# Patient Record
Sex: Female | Born: 1980 | Race: White | Hispanic: No | Marital: Married | Smoking: Never smoker
Health system: Southern US, Community
[De-identification: ages and names within clinical notes are randomized; demographics above are authoritative.]

## PROBLEM LIST (undated history)

## (undated) DIAGNOSIS — Z8742 Personal history of other diseases of the female genital tract: Secondary | ICD-10-CM

## (undated) DIAGNOSIS — B009 Herpesviral infection, unspecified: Secondary | ICD-10-CM

## (undated) DIAGNOSIS — K5190 Ulcerative colitis, unspecified, without complications: Secondary | ICD-10-CM

## (undated) DIAGNOSIS — K069 Disorder of gingiva and edentulous alveolar ridge, unspecified: Secondary | ICD-10-CM

## (undated) DIAGNOSIS — Z87442 Personal history of urinary calculi: Secondary | ICD-10-CM

## (undated) DIAGNOSIS — Z8719 Personal history of other diseases of the digestive system: Secondary | ICD-10-CM

## (undated) DIAGNOSIS — R87629 Unspecified abnormal cytological findings in specimens from vagina: Secondary | ICD-10-CM

## (undated) DIAGNOSIS — N921 Excessive and frequent menstruation with irregular cycle: Secondary | ICD-10-CM

## (undated) HISTORY — DX: Unspecified abnormal cytological findings in specimens from vagina: R87.629

## (undated) HISTORY — PX: CYSTOSCOPY, WITH RETROGRADE PYELOGRAM, URETEROSCOPY, URINARY CALCULUS LASER LITHOTRIPSY, AND STENT INSERT: SHX7550

## (undated) HISTORY — PX: OTHER SURGICAL HISTORY: SHX169

## (undated) HISTORY — DX: Ulcerative colitis, unspecified, without complications: K51.90

## (undated) HISTORY — DX: Herpesviral infection, unspecified: K06.9

## (undated) HISTORY — DX: Herpesviral infection, unspecified: B00.9

---
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
## 2009-09-05 ENCOUNTER — Encounter: Admission: RE | Admit: 2009-09-05 | Discharge: 2009-09-05 | Payer: Self-pay | Admitting: Family Medicine

## 2011-04-17 ENCOUNTER — Emergency Department (HOSPITAL_COMMUNITY)
Admission: EM | Admit: 2011-04-17 | Discharge: 2011-04-17 | Disposition: A | Discharge status: Home / Self Care | Payer: BC Managed Care – PPO | Attending: Emergency Medicine | Admitting: Emergency Medicine

## 2011-04-17 DIAGNOSIS — N200 Calculus of kidney: Secondary | ICD-10-CM | POA: Insufficient documentation

## 2011-04-17 DIAGNOSIS — R109 Unspecified abdominal pain: Secondary | ICD-10-CM | POA: Insufficient documentation

## 2011-04-17 DIAGNOSIS — R110 Nausea: Secondary | ICD-10-CM | POA: Insufficient documentation

## 2011-04-17 DIAGNOSIS — Z87442 Personal history of urinary calculi: Secondary | ICD-10-CM | POA: Insufficient documentation

## 2011-04-17 DIAGNOSIS — R300 Dysuria: Secondary | ICD-10-CM | POA: Insufficient documentation

## 2011-04-17 LAB — POCT I-STAT, CHEM 8
BUN: 21 mg/dL (ref 6–23)
Calcium, Ion: 1.14 mmol/L (ref 1.12–1.32)
Chloride: 105 meq/L (ref 96–112)
Creatinine, Ser: 1.3 mg/dL — ABNORMAL HIGH (ref 0.50–1.10)
Glucose, Bld: 88 mg/dL (ref 70–99)
HCT: 46 % (ref 36.0–46.0)
Hemoglobin: 15.6 g/dL — ABNORMAL HIGH (ref 12.0–15.0)
Potassium: 4.2 meq/L (ref 3.5–5.1)
Sodium: 138 meq/L (ref 135–145)
TCO2: 23 mmol/L (ref 0–100)

## 2011-04-17 LAB — URINALYSIS, ROUTINE W REFLEX MICROSCOPIC
Glucose, UA: NEGATIVE mg/dL
Ketones, ur: NEGATIVE mg/dL
pH: 5.5 (ref 5.0–8.0)

## 2011-04-17 LAB — URINE MICROSCOPIC-ADD ON

## 2011-04-17 LAB — POCT PREGNANCY, URINE: Preg Test, Ur: NEGATIVE

## 2013-08-13 HISTORY — PX: HIP ARTHROSCOPY: SUR88

## 2018-02-13 ENCOUNTER — Ambulatory Visit: Payer: Self-pay | Admitting: Family Medicine

## 2018-02-13 VITALS — BP 118/74 | HR 64 | Temp 98.9°F | Resp 20

## 2018-02-13 DIAGNOSIS — N3001 Acute cystitis with hematuria: Secondary | ICD-10-CM

## 2018-02-13 LAB — POCT URINALYSIS DIPSTICK
Glucose, UA: NEGATIVE
Ketones, UA: NEGATIVE
Leukocytes, UA: NEGATIVE
Nitrite, UA: POSITIVE
Protein, UA: POSITIVE — AB
Spec Grav, UA: >=1.03 — AB
Urobilinogen, UA: 0.2 U/dL
pH, UA: 6

## 2018-02-13 MED ORDER — DOXYCYCLINE HYCLATE 100 MG PO CAPS: 100.0000 mg | ORAL_CAPSULE | Freq: Two times a day (BID) | ORAL | 0 refills | Status: DC

## 2018-02-13 MED ORDER — FLUCONAZOLE 150 MG PO TABS: 150.0000 mg | ORAL_TABLET | Freq: Once | ORAL | 0 refills | Status: AC

## 2018-02-13 NOTE — Patient Instructions (Addendum)
Complete all medication as prescribed. If symptoms worsen or do not improve return for further evaluation. Take antibiotic with food to avoid stomach upset. I have also prescribed you a diflucan in the event vaginal irritation develops.   Urinary Tract Infection, Adult A urinary tract infection (UTI) is an infection of any part of the urinary tract. The urinary tract includes the:  Kidneys.  Ureters.  Bladder.  Urethra.  These organs make, store, and get rid of pee (urine) in the body. Follow these instructions at home:  Take over-the-counter and prescription medicines only as told by your doctor.  If you were prescribed an antibiotic medicine, take it as told by your doctor. Do not stop taking the antibiotic even if you start to feel better.  Avoid the following drinks: ? Alcohol. ? Caffeine. ? Tea. ? Carbonated drinks.  Drink enough fluid to keep your pee clear or pale yellow.  Keep all follow-up visits as told by your doctor. This is important.  Make sure to: ? Empty your bladder often and completely. Do not to hold pee for long periods of time. ? Empty your bladder before and after sex. ? Wipe from front to back after a bowel movement if you are female. Use each tissue one time when you wipe. Contact a doctor if:  You have back pain.  You have a fever.  You feel sick to your stomach (nauseous).  You throw up (vomit).  Your symptoms do not get better after 3 days.  Your symptoms go away and then come back. Get help right away if:  You have very bad back pain.  You have very bad lower belly (abdominal) pain.  You are throwing up and cannot keep down any medicines or water. This information is not intended to replace advice given to you by your health care provider. Make sure you discuss any questions you have with your health care provider. Document Released: 01/09/2008 Document Revised: 12/29/2015 Document Reviewed: 06/13/2015 Elsevier Interactive Patient  Education  [employer]2018 Elsevier Inc[employer]

## 2018-02-13 NOTE — Addendum Note (Signed)
Addended by: Denton BrickHOLLOWAY, Elhadji Pecore F on: 02/13/2018 08:03 PM   Modules accepted: Orders

## 2018-02-13 NOTE — Progress Notes (Signed)
Patient ID: Ellen Stewart, female    DOB: 1981-02-27, 37 y.o.   MRN: [id number]  PCP: No primary care provider on file.  Chief Complaint  Patient presents with  . uti symptoms    Subjective:  HPI Ellen Stewart is a 37 y.o. female presents for evaluation UTI. Symptoms include visible hematuria and dysuria.Denies flank or back pain, fever, chills, nausea or vomiting. She has taken over the counter medication for dysuria with some relief of pain. She has a history of UTI which have previously resolved with different antibiotic therapies. Social History   Socioeconomic History  . Marital status: Unknown    Spouse name: Not on file  . Number of children: Not on file  . Years of education: Not on file  . Highest education level: Not on file  Occupational History  . Not on file  Social Needs  . Financial resource strain: Not on file  . Food insecurity:    Worry: Not on file    Inability: Not on file  . Transportation needs:    Medical: Not on file    Non-medical: Not on file  Tobacco Use  . Smoking status: Not on file  Substance and Sexual Activity  . Alcohol use: Not on file  . Drug use: Not on file  . Sexual activity: Not on file  Lifestyle  . Physical activity:    Days per week: Not on file    Minutes per session: Not on file  . Stress: Not on file  Relationships  . Social connections:    Talks on phone: Not on file    Gets together: Not on file    Attends religious service: Not on file    Active member of club or organization: Not on file    Attends meetings of clubs or organizations: Not on file    Relationship status: Not on file  . Intimate partner violence:    Fear of current or ex partner: Not on file    Emotionally abused: Not on file    Physically abused: Not on file    Forced sexual activity: Not on file  Other Topics Concern  . Not on file  Social History Narrative  . Not on file   Review of Systems Pertinent negatives listed in HPI   Allergies not on file  Prior to Admission medications   Not on File    Past Medical, Surgical Family and Social History reviewed and updated.    Objective:   Today's Vitals   02/13/18 1933  BP: 118/74  Pulse: 64  Resp: 20  Temp: 98.9 F (37.2 C)  TempSrc: Oral    Physical Exam  Constitutional: She appears well-developed and well-nourished.  HENT:  Head: Normocephalic and atraumatic.  Cardiovascular: Normal rate, regular rhythm, normal heart sounds and intact distal pulses.  Pulmonary/Chest: Effort normal and breath sounds normal.  Abdominal: Soft. There is no CVA tenderness.  Skin: Skin is warm and dry.   Assessment & Plan:  1. Acute cystitis with hematuria, treating empirically with a broad-spectrum antibiotic. UA positive nitrates, protein and RBC. Patient give strict return instructions if symptoms worsen or do not improve to return for care.  Meds ordered this encounter  Medications  . doxycycline (VIBRAMYCIN) 100 MG capsule    Sig: Take 1 capsule (100 mg total) by mouth 2 (two) times daily.    Dispense:  20 capsule    Refill:  0  . fluconazole (DIFLUCAN) 150 MG tablet  Sig: Take 1 tablet (150 mg total) by mouth once for 1 dose.    Dispense:  1 tablet    Refill:  0    Godfrey Pick. Tiburcio Pea, MSN, FNP-C [facility]  [address]  [place], [place] [address] [phone]

## 2018-02-17 ENCOUNTER — Telehealth: Payer: Self-pay | Admitting: Emergency Medicine

## 2018-02-17 MED ORDER — CIPROFLOXACIN HCL 250 MG PO TABS: 250.0000 mg | ORAL_TABLET | Freq: Two times a day (BID) | ORAL | 0 refills | Status: AC

## 2018-02-19 NOTE — Telephone Encounter (Signed)
Pt had called stating urinary symptoms were worsening despite being on Doxycycline. Pt is out of town for a week. Discussed pt with Jerrilyn CairoKim Harris, NP, who pt initially saw. Cipro was called in to pharmacy for pt. Pt advised to f/u if still not improving after cipro

## 2018-08-06 HISTORY — PX: HYSTEROSCOPY WITH D & C: SHX1775

## 2019-01-11 ENCOUNTER — Encounter (INDEPENDENT_AMBULATORY_CARE_PROVIDER_SITE_OTHER): Payer: Self-pay

## 2019-01-11 ENCOUNTER — Telehealth: Payer: Self-pay | Admitting: Physician Assistant

## 2019-01-11 DIAGNOSIS — R05 Cough: Secondary | ICD-10-CM

## 2019-01-11 DIAGNOSIS — J029 Acute pharyngitis, unspecified: Secondary | ICD-10-CM

## 2019-01-11 DIAGNOSIS — R059 Cough, unspecified: Secondary | ICD-10-CM

## 2019-01-11 NOTE — Progress Notes (Signed)
E-Visit for Corona Virus Screening   Based on your current symptoms, you may very well have the virus, however your symptoms are mild. Currently, not all patients are being tested. If the symptoms are mild and there is not a known exposure, performing the test is not indicated.  You have been enrolled in MyChart Home Monitoring for COVID-19. Daily you will receive a questionnaire within the MyChart website. Our COVID-19 response team will be monitoring your responses daily.   Coronavirus disease 2019 (COVID-19)is a respiratory illness that can spread from person to person. The virus that causes COVID-19 is a new virus that was first identified in the country of [place] but is now found in multiple other countries and has spread to the [place].  Symptoms associated with the virus are mild to severe fever, cough, and shortness of breath. There is currently no vaccine to protect against COVID-19, and there is no specific antiviral treatment for the virus.   To be considered HIGH RISK for Coronavirus (COVID-19), you have to meet the following criteria:  . Traveled to [place], [place], [place], [place] or [place]; or in the [place] to [place], [place], [place], or [place]; and have fever, cough, and shortness of breath within the last 2 weeks of travel OR  . Been in close contact with a person diagnosed with COVID-19 within the last 2 weeks and have fever, cough, and shortness of breath  . IF YOU DO NOT MEET THESE CRITERIA, YOU ARE CONSIDERED LOW RISK FOR COVID-19.   It is vitally important that if you feel that you have an infection such as this virus or any other virus that you stay home and away from places where you may spread it to others.  You should self-quarantine for 14 days if you have symptoms that could potentially be coronavirus and avoid contact with people age 38 and older.     You may also take acetaminophen (Tylenol) as needed for fever.   Reduce your risk of any  infection by using the same precautions used for avoiding the common cold or flu: Wash your hands often with soap and warm water for at least 20 seconds.  If soap and water are not readily available, use an alcohol-based hand sanitizer with at least 60% alcohol.  If coughing or sneezing, cover your mouth and nose by coughing or sneezing into the elbow areas of your shirt or coat, into a tissue or into your sleeve (not your hands). Avoid shaking hands with others and consider head nods or verbal greetings only.  Avoid touching your eyes,nose, or mouth with unwashed hands. Avoid close contact with people who are sick. Avoid places or events with large numbers of people in one location, like concerts or sporting events. Carefully consider travel plans you have or are making. If you are planning any travel outside or inside the [place], visit the CDC'sTravelers' Health webpagefor the latest health notices. If you have some symptoms but not all symptoms, continue to monitor at home and seek medical attention if your symptoms worsen. If you are having a medical emergency, call 911.  HOME CARE Only take medications as instructed by your medical team. Drink plenty of fluids and get plenty of rest. A steam or ultrasonic humidifier can help if you have congestion.   GET HELP RIGHT AWAY IF: You develop worsening fever. You become short of breath You cough up blood. Your symptoms become more severe MAKE SURE YOU  Understand these instructions.  Will watch your condition. Will get help right away if you are not doing well or get worse.  Your e-visit answers were reviewed by a board certified advanced clinical practitioner to complete your personal care plan.  Depending on the condition, your plan could have included both over the counter or prescription medications.  If there is a problem please reply once you have received a response from your provider. Your safety is important to [place].  If you have  drug allergies check your prescription carefully.    You can use MyChart to ask questions about today's visit, request a non-urgent call back, or ask for a work or school excuse for 24 hours related to this e-Visit. If it has been greater than 24 hours you will need to follow up with your provider, or enter a new e-Visit to address those concerns. You will get an e-mail in the next two days asking about your experience.  I hope that your e-visit has been valuable and will speed your recovery. Thank you for using e-visits.    ===View-only below this line===   ----- Message -----    From: Hansel Feinstein    Sent: 01/11/2019 10:23 AM EDT      To: E-Visit Mailing List Subject: E-Visit Submission: CoronaVirus ([id number]-[id number]) Screening  E-Visit Submission: CoronaVirus ([id number]) Screening --------------------------------  Question: Do you have any of the following?  Answer:   Cough  Question: Do you have any of the following additional symptoms?  Answer:   Sore throat  Question: Have you had a fever? Answer:   No  Question: Have others in your home or workplace had similar symptoms? Answer:   No  Question: When did your symptoms start? Answer:   January 09, 2019  Question: Have you recently visited any of the following countries? Answer:   None of these  Question: If you have traveled anywhere in the last  2 months please document where you have visited: Answer:     Question: Have you recently been around others from these countries or visited these countries who have had coughing or fever? Answer:   No  Question: Have you recently been around anyone who has been diagnosed with Corona virus? Answer:   No  Question: Have you been taking any medications? Answer:   No  Question: If taking medications for these symptoms, please list the names and whether they are helping or not Answer:     Question: Are you treated for any of the following conditions: Asthma, COPD, Diabetes, Renal  Failure (on Dialysis), AIDS, any Neuromuscular disease that effects the clearing of secretions, Heart Failure, or Heart Disease? Answer:   No  Question: Please enter a phone number where you can be reached if we have additional questions about your symptoms Answer:   [phone]  Question: Please list your medication allergies that you may have ? (If 'none' , please list as 'none') Answer:   Percocet  Question: Please list any additional comments  Answer:   I am exposed to a lot people because of my job. I am requesting a Covid-19 test  Question: Are you pregnant? Answer:   I am confident that I am not pregnant  Question: Are you breastfeeding? Answer:   No  A total of 5-10 minutes was spent evaluating this patients questionnaire and formulating a plan of care.

## 2019-01-12 ENCOUNTER — Telehealth: Payer: Self-pay | Admitting: *Deleted

## 2019-01-12 ENCOUNTER — Encounter (INDEPENDENT_AMBULATORY_CARE_PROVIDER_SITE_OTHER): Payer: Self-pay

## 2019-01-12 NOTE — Telephone Encounter (Signed)
Contacted pt due to my chart response of worsening cough; recommendations made: 6: GENERAL CARE ADVICE FOR COVID-19 SYMPTOMS: * Cough: Use cough drops * Sore throat: Try throat lozenges, hard candy or warm chicken broth.    7: COUGH MEDICINES:  * OTC COUGH SYRUPS: The most common cough suppressant in OTC cough medications is dextromethorphan. Often the letters 'DM' appear in the name. * OTC COUGH DROPS: Cough drops can help a lot, especially for mild coughs. They reduce coughing by soothing your irritated throat and removing that tickle sensation in the back of the throat. Cough drops also have the advantage of portability - you can carry them with you. * HOME REMEDY - HARD CANDY: Hard candy works just as well as medicine-flavored OTC cough drops. People who have diabetes should use sugar-free candy. * HOME REMEDY - HONEY: This old home remedy has been shown to help decrease coughing at night. The adult dosage is 2 teaspoons (10 ml) at bedtime. Honey should not be given to infants under one year of age   Pt also advised to contact her PCP for further recommendations; she verbalized understanding.

## 2019-06-12 HISTORY — PX: HYSTEROSCOPY WITH D & C: SHX1775

## 2021-01-20 ENCOUNTER — Other Ambulatory Visit: Payer: Self-pay

## 2021-01-20 ENCOUNTER — Ambulatory Visit (INDEPENDENT_AMBULATORY_CARE_PROVIDER_SITE_OTHER): Payer: 59 | Admitting: Family Medicine

## 2021-01-20 ENCOUNTER — Encounter: Payer: Self-pay | Admitting: Family Medicine

## 2021-01-20 VITALS — BP 126/79 | HR 77 | Ht 61.5 in | Wt 110.8 lb

## 2021-01-20 DIAGNOSIS — Z0000 Encounter for general adult medical examination without abnormal findings: Secondary | ICD-10-CM

## 2021-01-20 DIAGNOSIS — R5383 Other fatigue: Secondary | ICD-10-CM

## 2021-01-20 NOTE — Progress Notes (Signed)
Hasnt had check up in a couple years Turning 40 soon Has night sweats started after covid Had Covid 2nd week of January Been really tired

## 2021-01-20 NOTE — Progress Notes (Signed)
Office Visit Note   Patient: Ellen Stewart           Date of Birth: Jul 30, 1981           MRN: [id number] Visit Date: 01/20/2021 Requested by: No referring provider defined for this encounter. PCP: No primary care provider on file.  Subjective: Chief Complaint  Patient presents with   PCP visit    HPI: She is here for a wellness examination.  She has not had 1 in years.  She goes to her gynecologist on a regular basis but not to a PCP.  About 4 months ago she had COVID-19 illness.  Prior to that she has had COVID vaccinations and boosters.  It was not a severe case of COVID, but ever since then she has struggled with fatigue.  The fatigue is not severe, but it is not normal for her to feel like this.  She occasionally has hot flashes but her menstrual cycles remain normal.  She does note that she has always had heavy bleeding.  She has never been diagnosed with anemia.  She denies any hair loss, skin or nail disorders.  She has gained about 5 pounds and cannot seem to lose it.  No constipation or diarrhea.  She does note that several years ago she was diagnosed with a mild case of ulcerative colitis.  This was after a year of diarrhea with bloody stools.  She was able to get this managed with some dietary changes.  Family history was reviewed.  Past surgery includes right hip arthroscopy for a labrum tear.  She has done well since then.               ROS:   All other systems were reviewed and are negative.  Objective: Vital Signs: BP 126/79   Pulse 77   Ht 5' 1.5" (1.562 m)   Wt 110 lb 12.8 oz (50.3 kg)   BMI 20.60 kg/m   Physical Exam:  General:  Alert and oriented, in no acute distress. Pulm:  Breathing unlabored. Psy:  Normal mood, congruent affect. Skin: No suspicious lesions HEENT:  [place]/AT, PERRLA, EOM Full, no nystagmus.  Funduscopic examination within normal limits.  No conjunctival erythema.  Tympanic membranes are pearly gray with normal landmarks.  External ear  canals are normal.  Nasal passages are clear.  Oropharynx is clear.  No significant lymphadenopathy.  No thyromegaly or nodules.  2+ carotid pulses without bruits. CV: Regular rate and rhythm without murmurs, rubs, or gallops.  No peripheral edema.  2+ radial and posterior tibial pulses. Lungs: Clear to auscultation throughout with no wheezing or areas of consolidation. Abdomen: No hepatosplenomegaly. Extremities: 2+ DTRs.  No nail deformities.   Imaging: No results found.  Assessment & Plan: Wellness examination -We will draw labs today.  2.  Fatigue, possibly post-COVID syndrome -Labs to rule out other causes of fatigue.  If negative, we will try vitamin protocol for long covid.  If still no improvement, then salivary hormone testing.     Procedures: No procedures performed        PMFS History: There are no problems to display for this patient.  History reviewed. No pertinent past medical history.  Family History  Problem Relation Age of Onset   Obesity Mother    Hypertension Mother    Cancer - Colon Maternal Uncle     History reviewed. No pertinent surgical history. Social History   Occupational History   Not on file  Tobacco Use  Smoking status: Not on file   Smokeless tobacco: Not on file  Substance and Sexual Activity   Alcohol use: Not on file   Drug use: Not on file   Sexual activity: Not on file

## 2021-01-23 ENCOUNTER — Telehealth: Payer: Self-pay | Admitting: Family Medicine

## 2021-01-23 LAB — COMPREHENSIVE METABOLIC PANEL
AG Ratio: 1.8 (calc) (ref 1.0–2.5)
ALT: 22 U/L (ref 6–29)
AST: 25 U/L (ref 10–30)
Albumin: 4.4 g/dL (ref 3.6–5.1)
Alkaline phosphatase (APISO): 48 U/L (ref 31–125)
BUN: 14 mg/dL (ref 7–25)
CO2: 28 mmol/L (ref 20–32)
Calcium: 9.5 mg/dL (ref 8.6–10.2)
Chloride: 103 mmol/L (ref 98–110)
Creat: 0.98 mg/dL (ref 0.50–1.10)
Globulin: 2.4 g/dL (calc) (ref 1.9–3.7)
Glucose, Bld: 71 mg/dL (ref 65–99)
Potassium: 4.8 mmol/L (ref 3.5–5.3)
Sodium: 139 mmol/L (ref 135–146)
Total Bilirubin: 0.4 mg/dL (ref 0.2–1.2)
Total Protein: 6.8 g/dL (ref 6.1–8.1)

## 2021-01-23 LAB — CBC WITH DIFFERENTIAL/PLATELET
Absolute Monocytes: 471 cells/uL (ref 200–950)
Basophils Absolute: 68 cells/uL (ref 0–200)
Basophils Relative: 1.1 %
Eosinophils Absolute: 143 cells/uL (ref 15–500)
Eosinophils Relative: 2.3 %
HCT: 41.4 % (ref 35.0–45.0)
Hemoglobin: 13.7 g/dL (ref 11.7–15.5)
Lymphs Abs: 1686 cells/uL (ref 850–3900)
MCH: 30.5 pg (ref 27.0–33.0)
MCHC: 33.1 g/dL (ref 32.0–36.0)
MCV: 92.2 fL (ref 80.0–100.0)
MPV: 10.8 fL (ref 7.5–12.5)
Monocytes Relative: 7.6 %
Neutro Abs: 3832 cells/uL (ref 1500–7800)
Neutrophils Relative %: 61.8 %
Platelets: 293 10*3/uL (ref 140–400)
RBC: 4.49 10*6/uL (ref 3.80–5.10)
RDW: 12 % (ref 11.0–15.0)
Total Lymphocyte: 27.2 %
WBC: 6.2 10*3/uL (ref 3.8–10.8)

## 2021-01-23 LAB — LIPID PANEL
Cholesterol: 162 mg/dL (ref <200)
HDL: 79 mg/dL (ref >=50)
LDL Cholesterol (Calc): 68 mg/dL (calc)
Non-HDL Cholesterol (Calc): 83 mg/dL (calc) (ref <130)
Total CHOL/HDL Ratio: 2.1 (calc) (ref <5.0)
Triglycerides: 69 mg/dL (ref <150)

## 2021-01-23 LAB — THYROID PANEL WITH TSH
Free Thyroxine Index: 2 (ref 1.4–3.8)
T3 Uptake: 32 % (ref 22–35)
T4, Total: 6.4 ug/dL (ref 5.1–11.9)
TSH: 1.74 mIU/L

## 2021-01-23 LAB — THYROID PEROXIDASE ANTIBODY: Thyroperoxidase Ab SerPl-aCnc: <1 IU/mL (ref <9)

## 2021-01-23 LAB — IRON,TIBC AND FERRITIN PANEL
%SAT: 57 % (calc) — ABNORMAL HIGH (ref 16–45)
Ferritin: 35 ng/mL (ref 16–154)
Iron: 157 ug/dL (ref 40–190)
TIBC: 275 mcg/dL (calc) (ref 250–450)

## 2021-01-23 LAB — HIGH SENSITIVITY CRP: hs-CRP: <0.3 mg/L

## 2021-01-23 LAB — VITAMIN D 25 HYDROXY (VIT D DEFICIENCY, FRACTURES): Vit D, 25-Hydroxy: 36 ng/mL (ref 30–100)

## 2021-01-23 LAB — SEDIMENTATION RATE: Sed Rate: 2 mm/h (ref 0–20)

## 2021-01-23 NOTE — Telephone Encounter (Signed)
Labs are notable for the following:  Vitamin D is 36.  We want this to be 50-80.  I recommend taking an additional vitamin D3 at 2000 IU daily.  Iron level is normal and ferritin level is 35.  It does not appear that you are significantly iron deficient.  Dietary sources of iron should be sufficient.  Everything else looks good.

## 2021-02-02 ENCOUNTER — Ambulatory Visit (HOSPITAL_BASED_OUTPATIENT_CLINIC_OR_DEPARTMENT_OTHER): Payer: Self-pay | Admitting: Obstetrics & Gynecology

## 2021-04-06 ENCOUNTER — Encounter (HOSPITAL_BASED_OUTPATIENT_CLINIC_OR_DEPARTMENT_OTHER): Payer: Self-pay | Admitting: Obstetrics & Gynecology

## 2021-04-06 ENCOUNTER — Other Ambulatory Visit: Payer: Self-pay

## 2021-04-06 ENCOUNTER — Ambulatory Visit (INDEPENDENT_AMBULATORY_CARE_PROVIDER_SITE_OTHER): Payer: 59 | Admitting: Obstetrics & Gynecology

## 2021-04-06 VITALS — BP 118/80 | HR 78 | Ht 61.5 in | Wt 106.4 lb

## 2021-04-06 DIAGNOSIS — N926 Irregular menstruation, unspecified: Secondary | ICD-10-CM

## 2021-04-06 DIAGNOSIS — R61 Generalized hyperhidrosis: Secondary | ICD-10-CM | POA: Diagnosis not present

## 2021-04-06 DIAGNOSIS — Z1231 Encounter for screening mammogram for malignant neoplasm of breast: Secondary | ICD-10-CM

## 2021-04-06 NOTE — Progress Notes (Signed)
40 y.o. G0 Married White or Caucasian female here for new patient visit.  Pt wanted appointment to establish care.  Has decided to change practices after being in the same practice since she was 18.  Reports cycles have typically been normal until this year.  Had Covid in January and is not sure if this is related.  This year, cycles start early or late.  Has hx of breakthrough bleeding.    Did have ultrasound done 03/22/2021.  Uterus was 6.8 x 4.3 x 32.cm with endometrium of 71mm.  Right and left ovaries were normal.  I reviewed her ultrasound report on her but do not have images.  D/w pt possible causes of irregular bleeding.  Pt does not have to be on hormonal therapy for treatment.  Declines IUD use as well.  Interested in treatment possibly with ablation or hysterectomy.   Last pap smear done 07/08/2020 per pt.  She cannot pull this up on her mobile EMR access so I am doubting this was actually done.  Last pap smear I see in her EMR access 03/2019.            Sexually active: No.  The current method of family planning is condoms. Exercising: Yes.      Health Maintenance: Pap:  last one pt can access in EMR was 2017.  Recommended today.  Pt wants to get records. History of abnormal Pap:  no MMG:  guidelines reivewed Colonoscopy:  has ulcerative colitis, last done 2018.  Done in [place]   reports that she has never smoked. She has never used smokeless tobacco. She reports that she does not drink alcohol and does not use drugs.  Past Medical History:  Diagnosis Date   abnormal cervical pap    Ulcerative colitis (HCC)     Past Surgical History:  Procedure Laterality Date   hip reconstruction Right     Current Outpatient Medications  Medication Sig Dispense Refill   acetaminophen (TYLENOL) 500 MG tablet TAKE ONE TABLET (500 MG DOSE) BY MOUTH ONCE AS NEEDED FOR PAIN FOR UP TO 10 DAYS.     buPROPion (WELLBUTRIN XL) 150 MG 24 hr tablet Take 3 tablets by mouth daily.     ibuprofen (ADVIL)  800 MG tablet 1 tablet every 6 hours as needed pain and cramping.     valACYclovir (VALTREX) 500 MG tablet Take 1 tablet by mouth daily.     No current facility-administered medications for this visit.    Family History  Problem Relation Age of Onset   Obesity Mother    Hypertension Mother    Cancer - Colon Maternal Uncle 35    Review of Systems  Constitutional: Negative.   Gastrointestinal: Negative.   Genitourinary:  Positive for menstrual problem.  Psychiatric/Behavioral: Negative.     Exam:   BP 118/80 (BP Location: Right Arm, Patient Position: Sitting, Cuff Size: Small)   Pulse 78   Ht 5' 1.5" (1.562 m)   Wt 106 lb 6.4 oz (48.3 kg)   BMI 19.78 kg/m   Height: 5' 1.5" (156.2 cm)  Physical Exam Constitutional:      Appearance: Normal appearance.  Pulmonary:     Effort: Pulmonary effort is normal.  Neurological:     General: No focal deficit present.     Mental Status: She is alert.  Psychiatric:        Mood and Affect: Mood normal.     Assessment/Plan: 1. Irregular bleeding - will have pt sign release of medical  records for ultrasound images and last pap smear.   - d/w pt work up for determining cause of irregular bleeding, then can discuss treatment options  2. Night sweats - Estradiol - Follicle stimulating hormone  3. Encounter for screening mammogram for malignant neoplasm of breast - MM 3D SCREEN BREAST BILATERAL; Future

## 2021-04-07 LAB — FOLLICLE STIMULATING HORMONE: FSH: 3 m[IU]/mL

## 2021-04-07 LAB — ESTRADIOL: Estradiol: 55.6 pg/mL

## 2021-04-09 ENCOUNTER — Encounter (HOSPITAL_BASED_OUTPATIENT_CLINIC_OR_DEPARTMENT_OTHER): Payer: Self-pay | Admitting: Obstetrics & Gynecology

## 2021-04-09 DIAGNOSIS — N926 Irregular menstruation, unspecified: Secondary | ICD-10-CM | POA: Insufficient documentation

## 2021-04-09 DIAGNOSIS — B009 Herpesviral infection, unspecified: Secondary | ICD-10-CM | POA: Insufficient documentation

## 2021-04-09 DIAGNOSIS — K5190 Ulcerative colitis, unspecified, without complications: Secondary | ICD-10-CM | POA: Insufficient documentation

## 2021-04-09 DIAGNOSIS — R61 Generalized hyperhidrosis: Secondary | ICD-10-CM | POA: Insufficient documentation

## 2021-04-11 ENCOUNTER — Encounter (HOSPITAL_BASED_OUTPATIENT_CLINIC_OR_DEPARTMENT_OTHER): Payer: Self-pay

## 2021-04-19 ENCOUNTER — Ambulatory Visit (HOSPITAL_BASED_OUTPATIENT_CLINIC_OR_DEPARTMENT_OTHER)
Admission: RE | Admit: 2021-04-19 | Discharge: 2021-04-19 | Disposition: A | Discharge status: Home / Self Care | Payer: 59 | Source: Ambulatory Visit | Attending: Obstetrics & Gynecology | Admitting: Obstetrics & Gynecology

## 2021-04-19 ENCOUNTER — Other Ambulatory Visit: Payer: Self-pay

## 2021-04-19 DIAGNOSIS — Z1231 Encounter for screening mammogram for malignant neoplasm of breast: Secondary | ICD-10-CM | POA: Diagnosis not present

## 2021-04-25 ENCOUNTER — Other Ambulatory Visit: Payer: Self-pay | Admitting: Obstetrics & Gynecology

## 2021-04-25 DIAGNOSIS — R928 Other abnormal and inconclusive findings on diagnostic imaging of breast: Secondary | ICD-10-CM

## 2021-05-10 ENCOUNTER — Ambulatory Visit
Admission: RE | Admit: 2021-05-10 | Discharge: 2021-05-10 | Disposition: A | Discharge status: Home / Self Care | Payer: Managed Care, Other (non HMO) | Source: Ambulatory Visit | Attending: Obstetrics & Gynecology | Admitting: Obstetrics & Gynecology

## 2021-05-10 ENCOUNTER — Other Ambulatory Visit: Payer: Self-pay

## 2021-05-10 ENCOUNTER — Ambulatory Visit
Admission: RE | Admit: 2021-05-10 | Discharge: 2021-05-10 | Disposition: A | Discharge status: Home / Self Care | Payer: 59 | Source: Ambulatory Visit | Attending: Obstetrics & Gynecology | Admitting: Obstetrics & Gynecology

## 2021-05-10 DIAGNOSIS — R928 Other abnormal and inconclusive findings on diagnostic imaging of breast: Secondary | ICD-10-CM

## 2021-05-17 ENCOUNTER — Ambulatory Visit (INDEPENDENT_AMBULATORY_CARE_PROVIDER_SITE_OTHER): Payer: Managed Care, Other (non HMO) | Admitting: Obstetrics & Gynecology

## 2021-05-17 ENCOUNTER — Other Ambulatory Visit: Payer: Self-pay

## 2021-05-17 ENCOUNTER — Encounter (HOSPITAL_BASED_OUTPATIENT_CLINIC_OR_DEPARTMENT_OTHER): Payer: Self-pay | Admitting: *Deleted

## 2021-05-17 ENCOUNTER — Encounter (HOSPITAL_BASED_OUTPATIENT_CLINIC_OR_DEPARTMENT_OTHER): Payer: Self-pay | Admitting: Obstetrics & Gynecology

## 2021-05-17 VITALS — BP 119/87 | HR 87 | Ht 61.75 in | Wt 105.2 lb

## 2021-05-17 DIAGNOSIS — N921 Excessive and frequent menstruation with irregular cycle: Secondary | ICD-10-CM | POA: Diagnosis not present

## 2021-05-17 DIAGNOSIS — R922 Inconclusive mammogram: Secondary | ICD-10-CM | POA: Diagnosis not present

## 2021-05-17 MED ORDER — TRANEXAMIC ACID 650 MG PO TABS: 1300.0000 mg | ORAL_TABLET | Freq: Three times a day (TID) | ORAL | 3 refills | Status: DC

## 2021-05-22 NOTE — Progress Notes (Signed)
GYNECOLOGY  VISIT  CC:   discuss recent MMG and outside records  HPI: 40 y.o. G0P0000 Married White or Caucasian female here for follow-up to discuss the above and additional plans for treatment for her bleeding.  Patient was seen relationally on April 06, 2021.  I have been able to obtain her outside records.  She did have an ultrasound that did not show any abnormal findings with normal-appearing ovaries, normal endometrium and a normal uterus.  There was no evidence of fibroids or endometrial polyps.  Also I did get the patient's last Pap.  This was July 08, 2020 and was negative with negative high-risk HPV.  Only results are discussed with patient.  Since her initial visit, the patient did undergo a mammogram.  She was called back for diagnostic imaging including an ultrasound.  The patient has extremely dense breasts.  Due to the significant density it is noted in her breast, abbreviated breast MRI was recommended.  I did perform a Risk manager model with the patient today and she does not have a risk assessment greater than 20% because she does not have any family history.  Her risk is greater than 10% as she is never had childbearing and due to her significant breast density.  Patient is amenable to screening breast MRI.  We will plan this in about 6 months.  We also discussed the patient's bleeding.  Cycles are not always regular.  They can be quite long and heavy.  She typically has about 2 days each month that are difficult to do much of anything due to the amount of bleeding.  She did want talk about hysterectomy but she is open to other treatment options.  Given the normal ultrasound, oral progesterone therapy, Lysteda, endometrial ablation, and hysterectomy are options for the patient.  Progesterone IUD is also an option.  She is not interested in IUD and does have some concern about being on any hormonal therapy.  Also she is not quite ready to proceed with any surgical intervention.  I  feel Ellen Stewart is a good option for her.  Side effects and risk discussed.  She would like to try this.  Dosing discussed.  She, nor her husband, are interested in future childbearing.  GYNECOLOGIC HISTORY: Patient's last menstrual period was 05/10/2021.  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     buPROPion (WELLBUTRIN XL) 150 MG 24 hr tablet Take 3 tablets by mouth daily.     ibuprofen (ADVIL) 800 MG tablet 1 tablet every 6 hours as needed pain and cramping.     valACYclovir (VALTREX) 500 MG tablet Take 1 tablet by mouth daily.     No current facility-administered medications on file prior to visit.    ALLERGIES: Oxycodone  and Percocet [oxycodone-acetaminophen]  Family History  Problem Relation Age of Onset   Obesity Mother    Hypertension Mother    Cervical cancer Maternal Grandmother        pt pretty sure   Cancer - Colon Maternal Uncle 2    SH: Married, non-smoker  Review of Systems  Genitourinary:        Menstrual bleeding issues   PHYSICAL EXAMINATION:    BP 119/87 (BP Location: Right Arm, Patient Position: Sitting, Cuff Size: Small)   Pulse 87   Ht 5' 1.75" (1.568 m)   Wt 105 lb 3.2 oz (47.7 kg)   LMP 05/10/2021   BMI 19.40 kg/m     General appearance: alert, cooperative and appears  stated age No other exam performed today.  Assessment/Plan: 1. Menorrhagia with irregular cycle - treatment options discussed as per above.   - Will plan to start with more conservative therapy.  tranexamic acid (LYSTEDA) 650 MG TABS tablet; Take 2 tablets (1,300 mg total) by mouth 3 (three) times daily. Stop taking once cycle improves.  Do not take for more than 5 days each month.  Dispense: 30 tablet; Refill: 3 - Recheck 3 months  2. Breast density - pt will continue to have yearly 3D MMG but will start screening breast MRI in six months.  Pt may decide to have this done yearly or every other year.  She is aware there are not standardized guidelines for this imaging modality at this time.  She is also aware this is an out of pocket cost for having this done.  Pt comfortable with all of the above.

## 2021-05-23 DIAGNOSIS — R922 Inconclusive mammogram: Secondary | ICD-10-CM | POA: Insufficient documentation

## 2021-06-07 ENCOUNTER — Encounter (HOSPITAL_BASED_OUTPATIENT_CLINIC_OR_DEPARTMENT_OTHER): Payer: Self-pay

## 2021-06-21 ENCOUNTER — Ambulatory Visit (HOSPITAL_BASED_OUTPATIENT_CLINIC_OR_DEPARTMENT_OTHER): Payer: 59 | Admitting: Obstetrics & Gynecology

## 2021-07-10 ENCOUNTER — Encounter (HOSPITAL_BASED_OUTPATIENT_CLINIC_OR_DEPARTMENT_OTHER): Payer: Self-pay | Admitting: Obstetrics & Gynecology

## 2021-07-11 ENCOUNTER — Other Ambulatory Visit (HOSPITAL_BASED_OUTPATIENT_CLINIC_OR_DEPARTMENT_OTHER): Payer: Self-pay | Admitting: Obstetrics & Gynecology

## 2021-07-11 MED ORDER — IBUPROFEN 800 MG PO TABS: ORAL_TABLET | ORAL | 1 refills | Status: DC

## 2021-09-06 ENCOUNTER — Encounter (HOSPITAL_BASED_OUTPATIENT_CLINIC_OR_DEPARTMENT_OTHER): Payer: Self-pay | Admitting: Obstetrics & Gynecology

## 2021-09-06 ENCOUNTER — Other Ambulatory Visit: Payer: Self-pay

## 2021-09-06 ENCOUNTER — Ambulatory Visit (INDEPENDENT_AMBULATORY_CARE_PROVIDER_SITE_OTHER): Payer: Managed Care, Other (non HMO) | Admitting: Obstetrics & Gynecology

## 2021-09-06 VITALS — BP 112/75 | HR 74 | Ht 62.0 in | Wt 104.6 lb

## 2021-09-06 DIAGNOSIS — Z01419 Encounter for gynecological examination (general) (routine) without abnormal findings: Secondary | ICD-10-CM

## 2021-09-06 DIAGNOSIS — R922 Inconclusive mammogram: Secondary | ICD-10-CM | POA: Diagnosis not present

## 2021-09-06 DIAGNOSIS — Z800 Family history of malignant neoplasm of digestive organs: Secondary | ICD-10-CM

## 2021-09-06 DIAGNOSIS — N921 Excessive and frequent menstruation with irregular cycle: Secondary | ICD-10-CM | POA: Diagnosis not present

## 2021-09-06 DIAGNOSIS — Z9189 Other specified personal risk factors, not elsewhere classified: Secondary | ICD-10-CM

## 2021-09-06 DIAGNOSIS — F3289 Other specified depressive episodes: Secondary | ICD-10-CM

## 2021-09-06 DIAGNOSIS — K5190 Ulcerative colitis, unspecified, without complications: Secondary | ICD-10-CM

## 2021-09-06 MED ORDER — BUPROPION HCL ER (XL) 150 MG PO TB24: 450.0000 mg | ORAL_TABLET | Freq: Every day | ORAL | 3 refills | Status: DC

## 2021-09-06 NOTE — Progress Notes (Signed)
41 y.o. G0P0000 Married White or Caucasian female here for annual exam.  Has tried [place].  Had heartburn the first month and took Tums.  It has helped some so she wants to continue with it for a few more months.  Planning screening breast MRI this spring.  Order will be placed.  Pt needs referral for colonoscopy due to family hx.  Had one done at 49 and follow up 5 years was recommended.          Sexually active: Yes.    The current method of family planning is condoms most of the time.     Upstream - 09/06/21 0825       Pregnancy Intention Screening   Does the patient want to become pregnant in the next year? No    Does the patient's partner want to become pregnant in the next year? No           Exercising: Yes.   Smoker:  no  Health Maintenance: Pap:  07/08/2020 Negative History of abnormal Pap:  yes MMG:  04/19/2021 Recommended additional images Colonoscopy:  2018 Screening Labs: has done with PCP this past summer   reports that she has never smoked. She has never used smokeless tobacco. She reports that she does not drink alcohol and does not use drugs.  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     buPROPion (WELLBUTRIN XL) 150 MG 24 hr tablet Take 3 tablets by mouth daily.     ibuprofen (ADVIL) 800 MG tablet 1 tablet every 8 hours as needed pain and cramping. 30 tablet 1   tranexamic acid (LYSTEDA) 650 MG TABS tablet Take 2 tablets (1,300 mg total) by mouth 3 (three) times daily. Stop taking once cycle improves.  Do not take for more than 5 days each month. 30 tablet 3    valACYclovir (VALTREX) 500 MG tablet Take 1 tablet by mouth daily.     No current facility-administered medications for this visit.    Family History  Problem Relation Age of Onset   Obesity Mother    Hypertension Mother    Cervical cancer Maternal Grandmother        pt pretty sure   Cancer - Colon Maternal Uncle 35    Review of Systems  Constitutional: Negative.   Genitourinary:        Menorrhagia   Exam:   BP 112/75 (BP Location: Right Arm, Patient Position: Sitting, Cuff Size: Normal)    Pulse 74    Ht 5\' 2"  (1.575 m)    Wt 104 lb 9.6 oz (47.4 kg)    LMP 08/07/2021    BMI 19.13 kg/m   Height: 5\' 2"  (157.5 cm)  General appearance: alert, cooperative and appears stated age Head: Normocephalic, without obvious abnormality, atraumatic Neck: no adenopathy, supple, symmetrical, trachea midline and thyroid normal to inspection and palpation Lungs: clear to auscultation bilaterally Breasts: normal appearance, no masses or tenderness Heart: regular rate and rhythm Abdomen: soft, non-tender; bowel sounds normal; no masses,  no organomegaly Extremities: extremities normal,  atraumatic, no cyanosis or edema Skin: Skin color, texture, turgor normal. No rashes or lesions Lymph nodes: Cervical, supraclavicular, and axillary nodes normal. No abnormal inguinal nodes palpated Neurologic: Grossly normal   Pelvic: External genitalia:  no lesions              Urethra:  normal appearing urethra with no masses, tenderness or lesions              Bartholins and Skenes: normal                 Vagina: normal appearing vagina with normal color and no discharge, no lesions              Cervix: no lesions              Pap taken: No. Bimanual Exam:  Uterus:  normal size, contour, position, consistency, mobility, non-tender              Adnexa: normal adnexa and no mass, fullness, tenderness               Rectovaginal: Confirms               Anus:  normal sphincter tone, no lesions  Chaperone,  Octaviano Batty, CMA, was present for exam.  Assessment/Plan: 1. Well woman exam with routine gynecological exam - pap neg with neg HR HPV 2021.  Not indicated today. - MMG 05/2021 - referral for colonoscopy placed - lab work done with PCP - declines any new vaccinations today  2. Menorrhagia with irregular cycle - will continue lysteda for next few months  3. Breast density  4. Increased risk of breast cancer - MR BREAST W & WO CM SCREENING (GI); Future  5. Ulcerative colitis without complications, unspecified location ([place])  6. Family history of colon cancer - Ambulatory referral to Gastroenterology  7. Other depression - buPROPion (WELLBUTRIN XL) 150 MG 24 hr tablet; Take 3 tablets (450 mg total) by mouth daily.  Dispense: 270 tablet; Refill: 3

## 2021-11-03 ENCOUNTER — Encounter (HOSPITAL_BASED_OUTPATIENT_CLINIC_OR_DEPARTMENT_OTHER): Payer: Self-pay | Admitting: Obstetrics & Gynecology

## 2021-11-03 ENCOUNTER — Other Ambulatory Visit (HOSPITAL_BASED_OUTPATIENT_CLINIC_OR_DEPARTMENT_OTHER): Payer: Self-pay | Admitting: *Deleted

## 2021-11-03 DIAGNOSIS — F3289 Other specified depressive episodes: Secondary | ICD-10-CM

## 2021-11-03 MED ORDER — BUPROPION HCL ER (XL) 150 MG PO TB24: 450.0000 mg | ORAL_TABLET | Freq: Every day | ORAL | 3 refills | Status: DC

## 2021-11-03 NOTE — Progress Notes (Signed)
Pt requests that Rx for wellbutrin be sent to [facility] instead of [facility] due to cost. Change made ?

## 2021-11-17 ENCOUNTER — Ambulatory Visit: Payer: Managed Care, Other (non HMO)

## 2021-11-20 ENCOUNTER — Ambulatory Visit
Admission: RE | Admit: 2021-11-20 | Discharge: 2021-11-20 | Disposition: A | Discharge status: Home / Self Care | Payer: No Typology Code available for payment source | Source: Ambulatory Visit | Attending: Obstetrics & Gynecology | Admitting: Obstetrics & Gynecology

## 2021-11-20 DIAGNOSIS — Z9189 Other specified personal risk factors, not elsewhere classified: Secondary | ICD-10-CM

## 2021-11-20 DIAGNOSIS — R922 Inconclusive mammogram: Secondary | ICD-10-CM

## 2021-11-20 MED ORDER — GADOBUTROL 1 MMOL/ML IV SOLN
5.0000 mL | Freq: Once | INTRAVENOUS | Status: AC | PRN
  Administered 2021-11-20: 5 mL via INTRAVENOUS

## 2022-01-21 ENCOUNTER — Encounter (HOSPITAL_BASED_OUTPATIENT_CLINIC_OR_DEPARTMENT_OTHER): Payer: Self-pay | Admitting: Obstetrics & Gynecology

## 2022-01-22 ENCOUNTER — Ambulatory Visit (INDEPENDENT_AMBULATORY_CARE_PROVIDER_SITE_OTHER): Payer: Self-pay

## 2022-01-22 DIAGNOSIS — R300 Dysuria: Secondary | ICD-10-CM

## 2022-01-22 LAB — POCT URINALYSIS DIPSTICK
Bilirubin, UA: NEGATIVE
Blood, UA: NEGATIVE
Glucose, UA: NEGATIVE
Ketones, UA: NEGATIVE
Leukocytes, UA: NEGATIVE
Nitrite, UA: POSITIVE
Protein, UA: NEGATIVE
Spec Grav, UA: 1.025 (ref 1.010–1.025)
Urobilinogen, UA: 0.2 E.U./dL
pH, UA: 5 (ref 5.0–8.0)

## 2022-01-22 MED ORDER — NITROFURANTOIN MONOHYD MACRO 100 MG PO CAPS: 100.0000 mg | ORAL_CAPSULE | Freq: Two times a day (BID) | ORAL | 0 refills | Status: DC

## 2022-01-22 MED ORDER — PHENAZOPYRIDINE HCL 200 MG PO TABS: 200.0000 mg | ORAL_TABLET | Freq: Three times a day (TID) | ORAL | 0 refills | Status: DC | PRN

## 2022-01-22 NOTE — Progress Notes (Signed)
Patient came in today with complaints of UTI. Patient states that she is having burning while urinating. She also states that she finished a round of Cipro and Pyridium and feels as if it did no good. I have called in the UTI protocol for patient. That would include Macrobid 100mg  twice a day for 5 days #10 tablets and Pyridium 200mg  three times a day #6 tablets. tbw

## 2022-01-22 NOTE — Telephone Encounter (Signed)
Called pt to advise that she should come to the office to provide [place] with a urine sample so that we can send for culture. Pt provided with appt.

## 2022-01-23 LAB — URINE CULTURE: Organism ID, Bacteria: NO GROWTH

## 2022-01-26 ENCOUNTER — Other Ambulatory Visit (HOSPITAL_BASED_OUTPATIENT_CLINIC_OR_DEPARTMENT_OTHER): Payer: Self-pay | Admitting: Obstetrics & Gynecology

## 2022-02-20 ENCOUNTER — Encounter (HOSPITAL_BASED_OUTPATIENT_CLINIC_OR_DEPARTMENT_OTHER): Payer: Self-pay | Admitting: Obstetrics & Gynecology

## 2022-04-03 ENCOUNTER — Encounter (HOSPITAL_BASED_OUTPATIENT_CLINIC_OR_DEPARTMENT_OTHER): Payer: Self-pay | Admitting: Obstetrics & Gynecology

## 2022-04-03 ENCOUNTER — Other Ambulatory Visit (HOSPITAL_BASED_OUTPATIENT_CLINIC_OR_DEPARTMENT_OTHER): Payer: Self-pay | Admitting: Obstetrics & Gynecology

## 2022-04-03 DIAGNOSIS — N921 Excessive and frequent menstruation with irregular cycle: Secondary | ICD-10-CM

## 2022-04-03 MED ORDER — TRANEXAMIC ACID 650 MG PO TABS: 1300.0000 mg | ORAL_TABLET | Freq: Three times a day (TID) | ORAL | 7 refills | Status: DC

## 2022-04-04 ENCOUNTER — Other Ambulatory Visit (HOSPITAL_BASED_OUTPATIENT_CLINIC_OR_DEPARTMENT_OTHER): Payer: Self-pay | Admitting: Obstetrics & Gynecology

## 2022-04-11 ENCOUNTER — Other Ambulatory Visit (HOSPITAL_BASED_OUTPATIENT_CLINIC_OR_DEPARTMENT_OTHER): Payer: Self-pay

## 2022-04-11 ENCOUNTER — Encounter (HOSPITAL_BASED_OUTPATIENT_CLINIC_OR_DEPARTMENT_OTHER): Payer: Self-pay | Admitting: Obstetrics & Gynecology

## 2022-04-11 DIAGNOSIS — Z8049 Family history of malignant neoplasm of other genital organs: Secondary | ICD-10-CM

## 2022-04-12 LAB — EMPOWER GYN GUIDELINES (2+17)

## 2022-04-27 ENCOUNTER — Other Ambulatory Visit: Payer: Self-pay | Admitting: Obstetrics & Gynecology

## 2022-04-27 DIAGNOSIS — Z1231 Encounter for screening mammogram for malignant neoplasm of breast: Secondary | ICD-10-CM

## 2022-05-25 ENCOUNTER — Ambulatory Visit
Admission: RE | Admit: 2022-05-25 | Discharge: 2022-05-25 | Disposition: A | Discharge status: Home / Self Care | Payer: Managed Care, Other (non HMO) | Source: Ambulatory Visit | Attending: Obstetrics & Gynecology | Admitting: Obstetrics & Gynecology

## 2022-05-25 DIAGNOSIS — Z1231 Encounter for screening mammogram for malignant neoplasm of breast: Secondary | ICD-10-CM

## 2022-09-10 ENCOUNTER — Encounter (HOSPITAL_BASED_OUTPATIENT_CLINIC_OR_DEPARTMENT_OTHER): Payer: Self-pay | Admitting: Obstetrics & Gynecology

## 2022-09-13 ENCOUNTER — Ambulatory Visit (HOSPITAL_BASED_OUTPATIENT_CLINIC_OR_DEPARTMENT_OTHER): Payer: Managed Care, Other (non HMO) | Admitting: Obstetrics & Gynecology

## 2022-09-13 ENCOUNTER — Encounter (HOSPITAL_BASED_OUTPATIENT_CLINIC_OR_DEPARTMENT_OTHER): Payer: Self-pay | Admitting: Obstetrics & Gynecology

## 2022-09-13 ENCOUNTER — Other Ambulatory Visit (HOSPITAL_COMMUNITY)
Admission: RE | Admit: 2022-09-13 | Discharge: 2022-09-13 | Disposition: A | Discharge status: Home / Self Care | Payer: Managed Care, Other (non HMO) | Source: Ambulatory Visit | Attending: Obstetrics & Gynecology | Admitting: Obstetrics & Gynecology

## 2022-09-13 VITALS — BP 114/81 | HR 68 | Ht 61.5 in | Wt 112.8 lb

## 2022-09-13 DIAGNOSIS — Z9189 Other specified personal risk factors, not elsewhere classified: Secondary | ICD-10-CM | POA: Diagnosis not present

## 2022-09-13 DIAGNOSIS — K5190 Ulcerative colitis, unspecified, without complications: Secondary | ICD-10-CM

## 2022-09-13 DIAGNOSIS — R9230 Dense breasts, unspecified: Secondary | ICD-10-CM

## 2022-09-13 DIAGNOSIS — Z124 Encounter for screening for malignant neoplasm of cervix: Secondary | ICD-10-CM | POA: Insufficient documentation

## 2022-09-13 DIAGNOSIS — Z01419 Encounter for gynecological examination (general) (routine) without abnormal findings: Secondary | ICD-10-CM | POA: Diagnosis not present

## 2022-09-13 DIAGNOSIS — N921 Excessive and frequent menstruation with irregular cycle: Secondary | ICD-10-CM

## 2022-09-13 DIAGNOSIS — Z800 Family history of malignant neoplasm of digestive organs: Secondary | ICD-10-CM

## 2022-09-13 NOTE — Patient Instructions (Signed)
Antevert or meclizine, 25mg 

## 2022-09-13 NOTE — Progress Notes (Signed)
42 y.o. G0P0000 Married White or Caucasian female here for annual exam.  She is doing well.  Has significant breast density.  Did have MMG 05/2022.  Had breast MRI 11/22/2021.  Pt is considering repeating this year.  Order placed.    Using TXA and doing well.  On Wellbutrin XL 300 -450 mg daily and she is adjusting the dose with seasons.  Doesn't need RF.  Patient's last menstrual period was 08/22/2022 (exact date).          Sexually active: Yes.    The current method of family planning is condoms all of the time.     Upstream - 09/13/22 0824       Pregnancy Intention Screening   Does the patient want to become pregnant in the next year? No    Does the patient's partner want to become pregnant in the next year? No    Would the patient like to discuss contraceptive options today? No      Contraception Wrap Up   Current Method Female Condom           Exercising: Yes.    Smoker:  yes  Health Maintenance: Pap:  07/08/2020 Negative History of abnormal Pap:  remote hx MMG:  05/25/2022 Negative Colonoscopy:  colonoscopy due last year BMD:   not indicated Screening Labs: with Dr. Junius Roads   reports that she has never smoked. She has never used smokeless tobacco. She reports that she does not drink alcohol and does not use drugs.  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     buPROPion (WELLBUTRIN XL) 150 MG 24 hr tablet Take 3 tablets (450 mg total) by mouth daily. 270 tablet 3   tranexamic acid (LYSTEDA) 650 MG TABS tablet Take 2 tablets (1,300 mg total) by mouth 3 (three) times daily.  Stop taking once cycle improves.  Do not take for more than 5 days each month. 30 tablet 7   valACYclovir (VALTREX) 500 MG tablet Take 1 tablet by mouth daily.     No current facility-administered medications for this visit.    Family History  Problem Relation Age of Onset   Obesity Mother    Hypertension Mother    Cervical cancer Maternal Grandmother        pt pretty sure   Cancer - Colon Maternal Uncle 35    ROS: Constitutional: negative Genitourinary:negative  Exam:   BP 114/81   Pulse 68   Ht 5' 1.5" (1.562 m)   Wt 112 lb 12.8 oz (51.2 kg)   LMP 08/22/2022 (Exact Date)   BMI 20.97 kg/m   Height: 5' 1.5" (156.2 cm)  General appearance: alert, cooperative and appears stated age Head: Normocephalic, without obvious abnormality, atraumatic Neck: no adenopathy, supple, symmetrical, trachea midline and thyroid normal to inspection and palpation Lungs: clear to auscultation bilaterally Breasts: normal appearance, no masses or tenderness Heart: regular rate and rhythm Abdomen: soft, non-tender; bowel sounds normal; no masses,  no organomegaly Extremities: extremities normal, atraumatic, no cyanosis or edema Skin: Skin color, texture, turgor  normal. No rashes or lesions Lymph nodes: Cervical, supraclavicular, and axillary nodes normal. No abnormal inguinal nodes palpated Neurologic: Grossly normal   Pelvic: External genitalia:  no lesions              Urethra:  normal appearing urethra with no masses, tenderness or lesions              Bartholins and Skenes: normal                 Vagina: normal appearing vagina with normal color and no discharge, no lesions              Cervix: no lesions              Pap taken: Yes.   Bimanual Exam:  Uterus:  normal size, contour, position, consistency, mobility, non-tender              Adnexa: normal adnexa and no mass, fullness, tenderness               Rectovaginal: Confirms               Anus:  normal sphincter tone, no  lesions  Chaperone, Octaviano Batty, CMA, was present for exam.  Assessment/Plan: 1. Well woman exam with routine gynecological exam - Pap smear obtained today per pt request - Mammogram 05/2022 - Colonoscopy referral placed again.  Pt did try to make appt last year. - Bone mineral density not indicated - lab work done with PCP, Dr. Junius Roads - vaccines reviewed/updated  2. At increased risk of breast cancer - MR BREAST W & WO CM SCREENING (GI); Future  3. Breast density  4. Cervical cancer screening - Cytology - PAP( [place])  5. Family history of colon cancer - Ambulatory referral to Gastroenterology  6. Ulcerative colitis without complications, unspecified location ([place])  7. Menorrhagia with irregular cycle - using Lysteda.  Does not need RF yet.

## 2022-09-14 ENCOUNTER — Encounter: Payer: Self-pay | Admitting: Gastroenterology

## 2022-09-14 ENCOUNTER — Encounter (HOSPITAL_BASED_OUTPATIENT_CLINIC_OR_DEPARTMENT_OTHER): Payer: Self-pay | Admitting: Obstetrics & Gynecology

## 2022-09-21 LAB — CYTOLOGY - PAP
Comment: NEGATIVE
Diagnosis: UNDETERMINED — AB
High risk HPV: NEGATIVE

## 2022-09-22 ENCOUNTER — Encounter (HOSPITAL_BASED_OUTPATIENT_CLINIC_OR_DEPARTMENT_OTHER): Payer: Self-pay | Admitting: Obstetrics & Gynecology

## 2022-09-24 ENCOUNTER — Encounter: Payer: Self-pay | Admitting: *Deleted

## 2022-10-17 ENCOUNTER — Ambulatory Visit: Payer: Managed Care, Other (non HMO) | Admitting: Gastroenterology

## 2022-11-12 ENCOUNTER — Ambulatory Visit
Admission: RE | Admit: 2022-11-12 | Discharge: 2022-11-12 | Disposition: A | Discharge status: Home / Self Care | Payer: Managed Care, Other (non HMO) | Source: Ambulatory Visit | Attending: Obstetrics & Gynecology | Admitting: Obstetrics & Gynecology

## 2022-11-12 DIAGNOSIS — R9230 Dense breasts, unspecified: Secondary | ICD-10-CM

## 2022-11-12 DIAGNOSIS — Z9189 Other specified personal risk factors, not elsewhere classified: Secondary | ICD-10-CM

## 2022-11-12 MED ORDER — GADOPICLENOL 0.5 MMOL/ML IV SOLN
7.5000 mL | Freq: Once | INTRAVENOUS | Status: AC | PRN
  Administered 2022-11-12: 5.5 mL via INTRAVENOUS

## 2022-11-20 ENCOUNTER — Ambulatory Visit (HOSPITAL_BASED_OUTPATIENT_CLINIC_OR_DEPARTMENT_OTHER): Payer: Managed Care, Other (non HMO)

## 2022-11-20 ENCOUNTER — Encounter (HOSPITAL_BASED_OUTPATIENT_CLINIC_OR_DEPARTMENT_OTHER): Payer: Self-pay | Admitting: Obstetrics & Gynecology

## 2022-11-20 DIAGNOSIS — R350 Frequency of micturition: Secondary | ICD-10-CM

## 2022-11-20 DIAGNOSIS — R300 Dysuria: Secondary | ICD-10-CM | POA: Diagnosis not present

## 2022-11-20 LAB — POCT URINALYSIS DIPSTICK
Bilirubin, UA: NEGATIVE
Blood, UA: NEGATIVE
Glucose, UA: NEGATIVE
Ketones, UA: NEGATIVE
Leukocytes, UA: NEGATIVE
Nitrite, UA: NEGATIVE
Protein, UA: NEGATIVE
Spec Grav, UA: >=1.03 — AB (ref 1.010–1.025)
Urobilinogen, UA: 0.2 E.U./dL
pH, UA: 5 (ref 5.0–8.0)

## 2022-11-20 NOTE — Progress Notes (Signed)
Patient came in today to have urine sample evaluated.Patient states she is having urinary frequency and a little discomfort when urinating. Sample sent off for culture. tbw

## 2022-11-22 ENCOUNTER — Other Ambulatory Visit (HOSPITAL_BASED_OUTPATIENT_CLINIC_OR_DEPARTMENT_OTHER): Payer: Self-pay | Admitting: Obstetrics & Gynecology

## 2022-11-22 DIAGNOSIS — Z1231 Encounter for screening mammogram for malignant neoplasm of breast: Secondary | ICD-10-CM

## 2022-11-22 LAB — URINE CULTURE

## 2022-12-07 ENCOUNTER — Other Ambulatory Visit (HOSPITAL_BASED_OUTPATIENT_CLINIC_OR_DEPARTMENT_OTHER): Payer: Self-pay | Admitting: Obstetrics & Gynecology

## 2022-12-07 ENCOUNTER — Encounter (HOSPITAL_BASED_OUTPATIENT_CLINIC_OR_DEPARTMENT_OTHER): Payer: Self-pay | Admitting: Obstetrics & Gynecology

## 2022-12-07 DIAGNOSIS — F3289 Other specified depressive episodes: Secondary | ICD-10-CM

## 2023-01-23 HISTORY — PX: COLONOSCOPY WITH PROPOFOL: SHX5780

## 2023-02-20 ENCOUNTER — Encounter (HOSPITAL_BASED_OUTPATIENT_CLINIC_OR_DEPARTMENT_OTHER): Payer: Self-pay | Admitting: Obstetrics & Gynecology

## 2023-02-22 ENCOUNTER — Other Ambulatory Visit (HOSPITAL_BASED_OUTPATIENT_CLINIC_OR_DEPARTMENT_OTHER): Payer: Self-pay

## 2023-02-22 ENCOUNTER — Other Ambulatory Visit (HOSPITAL_BASED_OUTPATIENT_CLINIC_OR_DEPARTMENT_OTHER): Payer: Self-pay | Admitting: Obstetrics & Gynecology

## 2023-02-22 ENCOUNTER — Other Ambulatory Visit: Payer: Self-pay | Admitting: Obstetrics & Gynecology

## 2023-02-22 ENCOUNTER — Telehealth (HOSPITAL_BASED_OUTPATIENT_CLINIC_OR_DEPARTMENT_OTHER): Payer: Self-pay | Admitting: Obstetrics & Gynecology

## 2023-02-22 DIAGNOSIS — N926 Irregular menstruation, unspecified: Secondary | ICD-10-CM

## 2023-02-22 NOTE — Telephone Encounter (Signed)
Called pt in response to my chart message.  She is going to come for lab work this afternoon.  Lab orders printed.  Appt made on lab schedule.

## 2023-02-23 LAB — ESTRADIOL: Estradiol: 280 pg/mL

## 2023-02-23 LAB — FOLLICLE STIMULATING HORMONE: FSH: 3.6 m[IU]/mL

## 2023-05-26 ENCOUNTER — Encounter (HOSPITAL_BASED_OUTPATIENT_CLINIC_OR_DEPARTMENT_OTHER): Payer: Self-pay | Admitting: Obstetrics & Gynecology

## 2023-05-31 ENCOUNTER — Ambulatory Visit
Admission: RE | Admit: 2023-05-31 | Discharge: 2023-05-31 | Disposition: A | Discharge status: Home / Self Care | Payer: Managed Care, Other (non HMO) | Source: Ambulatory Visit | Attending: Obstetrics & Gynecology | Admitting: Obstetrics & Gynecology

## 2023-05-31 DIAGNOSIS — Z1231 Encounter for screening mammogram for malignant neoplasm of breast: Secondary | ICD-10-CM

## 2023-06-10 ENCOUNTER — Encounter (HOSPITAL_BASED_OUTPATIENT_CLINIC_OR_DEPARTMENT_OTHER): Payer: Self-pay | Admitting: Obstetrics & Gynecology

## 2023-06-11 ENCOUNTER — Other Ambulatory Visit (HOSPITAL_BASED_OUTPATIENT_CLINIC_OR_DEPARTMENT_OTHER): Payer: Self-pay | Admitting: *Deleted

## 2023-06-11 DIAGNOSIS — N921 Excessive and frequent menstruation with irregular cycle: Secondary | ICD-10-CM

## 2023-06-11 MED ORDER — TRANEXAMIC ACID 650 MG PO TABS
1300.0000 mg | ORAL_TABLET | Freq: Three times a day (TID) | ORAL | 1 refills | Status: DC
Start: 2023-06-11 — End: 2023-10-01

## 2023-06-12 ENCOUNTER — Ambulatory Visit (HOSPITAL_BASED_OUTPATIENT_CLINIC_OR_DEPARTMENT_OTHER): Payer: Managed Care, Other (non HMO) | Admitting: Obstetrics & Gynecology

## 2023-06-12 ENCOUNTER — Encounter (HOSPITAL_BASED_OUTPATIENT_CLINIC_OR_DEPARTMENT_OTHER): Payer: Self-pay | Admitting: Obstetrics & Gynecology

## 2023-06-12 VITALS — BP 129/77 | HR 76 | Ht 61.5 in | Wt 117.5 lb

## 2023-06-12 DIAGNOSIS — N920 Excessive and frequent menstruation with regular cycle: Secondary | ICD-10-CM | POA: Diagnosis not present

## 2023-06-12 NOTE — Progress Notes (Signed)
GYNECOLOGY  VISIT  CC:   discuss surgery  HPI: 42 y.o. G0P0000 Married White or Caucasian female here for discussion of menorrhagia.  Reports her cycles are worsening.  She uses super tampons and feels she probably should use super plus tampons but these are just uncomfortable.  She changes once an hour.  She doesn't have to use pads except panty liners.  She does have some clots but they aren't large.  She feels she is having more cramping as well.  She is using TXA and this does help with the length of her cycles and maybe it helped with volume of flow but now as this is worsening, the TXA doesn't seem to be helping as much.  She is desirous of discussing more definitive surgery at this point.  She does not want to be on any hormonal therapy for bleeding control.  Does not want an IUD either.  She is interested in considering definitive treatment.  I think this is reasonable.  She is considering this in 2025 and wanted to start thinking about timing and how scheduling worked.    Last ultrasound in 2022 so will plan to repeat and endometrial biopsy recommended.    She does have a hx of endometrial polyps and has these removed in 2020.  Last pap smear was ASCUS with neg HR HPV 09/2022.     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     buPROPion (WELLBUTRIN XL) 150 MG 24 hr tablet TAKE THREE TABLETS BY MOUTH DAILY 270 tablet 2   tranexamic acid (LYSTEDA) 650 MG TABS tablet Take 2 tablets (1,300 mg total) by mouth 3 (three) times daily. Stop taking once cycle improves.  Do not take for more than 5 days each month. 30 tablet 1   valACYclovir (VALTREX)  500 MG tablet Take 1 tablet by mouth daily.     No current facility-administered medications on file prior to visit.    ALLERGIES: Oxycodone and Percocet [oxycodone-acetaminophen]  SH:  married, non smoker  Review of Systems  Constitutional: Negative.   Genitourinary:        Menorrhagia    PHYSICAL EXAMINATION:    BP 129/77 (BP Location: Right Arm, Patient Position: Sitting, Cuff Size: Normal)   Pulse 76   Ht 5' 1.5" (1.562 m)   Wt 117 lb 8 oz (53.3 kg)   BMI 21.84 kg/m     Physical Exam Constitutional:      Appearance: Normal appearance.  Neurological:     General: No focal deficit present.     Mental Status: She is alert.  Psychiatric:        Mood and Affect: Mood normal.     Assessment/Plan: 1. Menorrhagia with regular cycle - will have pt return for ultrasound and endometrial biopsy - US PELVIS TRANSVAGINAL NON-OB (TV ONLY); Future   Total time with pt:  22 minutes Documentation:  6 minutes Total time:  28 minutes

## 2023-07-09 ENCOUNTER — Institutional Professional Consult (permissible substitution) (HOSPITAL_BASED_OUTPATIENT_CLINIC_OR_DEPARTMENT_OTHER): Payer: Managed Care, Other (non HMO) | Admitting: Obstetrics & Gynecology

## 2023-08-14 ENCOUNTER — Ambulatory Visit (HOSPITAL_BASED_OUTPATIENT_CLINIC_OR_DEPARTMENT_OTHER): Payer: Managed Care, Other (non HMO)

## 2023-08-14 ENCOUNTER — Ambulatory Visit (HOSPITAL_BASED_OUTPATIENT_CLINIC_OR_DEPARTMENT_OTHER): Payer: Managed Care, Other (non HMO) | Admitting: Obstetrics & Gynecology

## 2023-08-14 VITALS — BP 117/77 | HR 72 | Ht 61.75 in | Wt 116.4 lb

## 2023-08-14 DIAGNOSIS — N921 Excessive and frequent menstruation with irregular cycle: Secondary | ICD-10-CM

## 2023-08-14 DIAGNOSIS — N920 Excessive and frequent menstruation with regular cycle: Secondary | ICD-10-CM | POA: Diagnosis not present

## 2023-08-17 ENCOUNTER — Encounter (HOSPITAL_BASED_OUTPATIENT_CLINIC_OR_DEPARTMENT_OTHER): Payer: Self-pay | Admitting: Obstetrics & Gynecology

## 2023-08-17 NOTE — Progress Notes (Signed)
 GYNECOLOGY  VISIT  CC:   Discuss ultrasound results, h/o menorrhagia  HPI: 43 y.o. G0P0000 Married White or Caucasian female here for discussion of ultrasound results.  Ultrasound obtained due to heavy bleeding.  She is on Lysteda  with some improvement in symptoms.  Has been on OCPs in the past but stopped due to side effects.  Uterus 8 x 5.6 x 4.0cm.  Endometrium 1.2cm.  Ovaries normal.  She is ready to proceed with more definitive treatment as Lysteda  as helped but not as much as she would like.  She is sure she does not want future child bearing.  Discussed timing of surgery.  She declined additional hormonal therapy.  Not interested in endometrial ablation either.  We reviewed procedure, hospital stay, recovery.  Questions answered.   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     buPROPion  (WELLBUTRIN  XL) 150 MG 24 hr tablet TAKE THREE TABLETS BY MOUTH DAILY 270 tablet 2   tranexamic acid  (LYSTEDA ) 650 MG TABS tablet Take 2 tablets (1,300 mg total) by mouth 3 (three) times daily. Stop taking once cycle improves.  Do not take for more than 5 days each month. 30 tablet 1   valACYclovir (VALTREX) 500 MG tablet Take 1 tablet by mouth daily.     No current facility-administered medications on file prior to visit.    ALLERGIES: Oxycodone and Ibuprofen   SH:  married, non smoker  Review of Systems  Constitutional: Negative.   Genitourinary:        Menorrhagia    PHYSICAL EXAMINATION:    BP 117/77 (BP Location: Left Arm, Patient Position: Sitting, Cuff Size: Normal)   Pulse 72   Ht 5' 1.75 (1.568 m)   Wt 116 lb 6.4 oz (52.8 kg)   LMP  07/26/2023   BMI 21.46 kg/m      Physical Exam Constitutional:      Appearance: Normal appearance.  Skin:    General: Skin is warm and dry.  Neurological:     General: No focal deficit present.     Mental Status: She is alert.  Psychiatric:        Mood and Affect: Mood normal.     Assessment/Plan: 1. Menorrhagia with irregular cycle ([place]) - will proceed with surgical scheduling - Ambulatory Referral For Surgery Scheduling - recommended endometrial biopsy and will have her return for this closer to time of surgery

## 2023-08-29 ENCOUNTER — Telehealth: Payer: Self-pay

## 2023-08-29 NOTE — Telephone Encounter (Signed)
Called patient to see if she was available to have surgery w/ Dr. Hyacinth Meeker on 3/25 @noon . Left voicemail asking patient to call me back to schedule at [phone].

## 2023-09-02 ENCOUNTER — Other Ambulatory Visit (HOSPITAL_BASED_OUTPATIENT_CLINIC_OR_DEPARTMENT_OTHER): Payer: Managed Care, Other (non HMO)

## 2023-09-02 ENCOUNTER — Other Ambulatory Visit: Payer: Self-pay

## 2023-09-10 ENCOUNTER — Telehealth: Payer: Self-pay

## 2023-09-10 NOTE — Telephone Encounter (Signed)
Patient immediately called back and would like to change her surgery date w/ Dr. Hyacinth Meeker to 12/03/23 . Same surgery time(7:30 am).

## 2023-09-10 NOTE — Telephone Encounter (Signed)
 Patient called to schedule surgery w/ Dr. Cleotilde. Patient would like to have surgery on 11/12/23 @[facility] at 7:30 am. Patient is aware she must arrive for pre-op at 5:30 am on 11/12/23. Surgery details and pre-op instructions were provided by phone. Written surgery details will be sent to patients Mychart for confirmation.

## 2023-09-11 ENCOUNTER — Encounter (HOSPITAL_BASED_OUTPATIENT_CLINIC_OR_DEPARTMENT_OTHER): Payer: Self-pay

## 2023-09-16 ENCOUNTER — Encounter (HOSPITAL_BASED_OUTPATIENT_CLINIC_OR_DEPARTMENT_OTHER): Payer: Self-pay | Admitting: Obstetrics & Gynecology

## 2023-09-23 ENCOUNTER — Encounter (HOSPITAL_BASED_OUTPATIENT_CLINIC_OR_DEPARTMENT_OTHER): Payer: Self-pay | Admitting: Obstetrics & Gynecology

## 2023-09-26 ENCOUNTER — Ambulatory Visit (HOSPITAL_BASED_OUTPATIENT_CLINIC_OR_DEPARTMENT_OTHER): Payer: Managed Care, Other (non HMO) | Admitting: Obstetrics & Gynecology

## 2023-09-29 ENCOUNTER — Encounter (HOSPITAL_BASED_OUTPATIENT_CLINIC_OR_DEPARTMENT_OTHER): Payer: Self-pay | Admitting: Obstetrics & Gynecology

## 2023-10-01 ENCOUNTER — Ambulatory Visit (HOSPITAL_BASED_OUTPATIENT_CLINIC_OR_DEPARTMENT_OTHER): Payer: Managed Care, Other (non HMO) | Admitting: Obstetrics & Gynecology

## 2023-10-01 ENCOUNTER — Encounter (HOSPITAL_BASED_OUTPATIENT_CLINIC_OR_DEPARTMENT_OTHER): Payer: Self-pay | Admitting: Obstetrics & Gynecology

## 2023-10-01 ENCOUNTER — Other Ambulatory Visit (HOSPITAL_COMMUNITY)
Admission: RE | Admit: 2023-10-01 | Discharge: 2023-10-01 | Disposition: A | Discharge status: Home / Self Care | Payer: Managed Care, Other (non HMO) | Source: Ambulatory Visit | Attending: Obstetrics & Gynecology | Admitting: Obstetrics & Gynecology

## 2023-10-01 VITALS — BP 126/81 | HR 64 | Ht 62.0 in | Wt 117.0 lb

## 2023-10-01 DIAGNOSIS — Z01419 Encounter for gynecological examination (general) (routine) without abnormal findings: Secondary | ICD-10-CM

## 2023-10-01 DIAGNOSIS — R9230 Dense breasts, unspecified: Secondary | ICD-10-CM | POA: Diagnosis not present

## 2023-10-01 DIAGNOSIS — N921 Excessive and frequent menstruation with irregular cycle: Secondary | ICD-10-CM

## 2023-10-01 DIAGNOSIS — N926 Irregular menstruation, unspecified: Secondary | ICD-10-CM

## 2023-10-01 DIAGNOSIS — Z9189 Other specified personal risk factors, not elsewhere classified: Secondary | ICD-10-CM

## 2023-10-01 MED ORDER — TRANEXAMIC ACID 650 MG PO TABS: 1300.0000 mg | ORAL_TABLET | Freq: Three times a day (TID) | ORAL | 3 refills | Status: DC

## 2023-10-01 NOTE — Patient Instructions (Signed)
 Marland Kitchen

## 2023-10-01 NOTE — Progress Notes (Signed)
 ANNUAL EXAM Patient name: Ellen Stewart MRN [id number]  Date of birth: May 04, 1981 Chief Complaint:   AEX, discussion of upcoming  History of Present Illness:   Ellen Stewart is a 43 y.o. G0P0000 Caucasian female being seen today for a routine annual exam.  Reports she's had three cycles in the last two more.  Each was a week long.  Surgery is scheduled for 4/29.  Says this can't come soon enough.  Using TXA with each cycle.  Using condoms for contraception.    Patient's last menstrual period was 09/27/2023 (exact date).   Upstream - 10/01/23 0915       Pregnancy Intention Screening   Does the patient want to become pregnant in the next year? No    Does the patient's partner want to become pregnant in the next year? No    Would the patient like to discuss contraceptive options today? No      Contraception Wrap Up   Current Method Female Condom           Procedure discussed with patient.  Recovery and pain management discussed.  Risks discussed including but not limited to bleeding, rare risk of transfusion, infection, 1% risk of uterine perforation with risks of fluid deficit causing cardiac arrythmia, cerebral swelling and/or need to stop procedure early.  Fluid emboli and rare risk of death discussed.  DVT/PE, rare risk of risk of bowel/bladder/ureteral/vascular injury.  Patient aware if pathology abnormal she may need additional treatment.  All questions answered.     Last pap 09/13/22. Results were: ASCUS w/ HRHPV negative. H/O abnormal pap: yes Last mammogram: 05/31/23. Results were: normal. Family h/o breast cancer: no Last colonoscopy: 01/2023. Results were: normal. Family h/o colorectal cancer: yes, Maternal uncle age 23.     10/01/2023    9:15 AM 08/14/2023    2:42 PM 06/12/2023    1:25 PM 09/13/2022    8:23 AM 09/06/2021    8:25 AM  Depression screen PHQ 2/9  Decreased Interest 0 0 0 0 0  Down, Depressed, Hopeless 0 0 0 0 0  PHQ - 2 Score 0 0 0 0 0     Review of  Systems:   Pertinent items are noted in HPI  Denies any headaches, blurred vision, fatigue, shortness of breath, chest pain, abdominal pain, abnormal vaginal discharge/itching/odor/irritation, problems with periods, bowel movements, urination, or intercourse unless otherwise stated above. Pertinent History Reviewed:  Reviewed past medical,surgical, social and family history.   Reviewed problem list, medications and allergies. Physical Assessment:   Vitals:   10/01/23 0911  BP: 126/81  Pulse: 64  Weight: 117 lb (53.1 kg)  Height: 5\' 2"  (1.575 m)  Body mass index is 21.4 kg/m.        Physical Examination:   General appearance - well appearing, and in no distress  Mental status - alert, oriented to person, place, and time  Psych:  She has a normal mood and affect  Skin - warm and dry, normal color, no suspicious lesions noted  Chest - effort normal, all lung fields clear to auscultation bilaterally  Heart - normal rate and regular rhythm  Neck:  midline trachea, no thyromegaly or nodules  Breasts - breasts appear normal, no suspicious masses, no skin or nipple changes or  axillary nodes  Abdomen - soft, nontender, nondistended, no masses or organomegaly  Pelvic - VULVA: normal appearing vulva with no masses, tenderness or lesions   VAGINA: normal appearing vagina with normal color and  discharge, no lesions   CERVIX: normal appearing cervix without discharge or lesions, no CMT  Thin prep pap is not done.  UTERUS: uterus is felt to be normal size, shape, consistency and nontender   ADNEXA: No adnexal masses or tenderness noted.  Rectal - deferred  Extremities:  No swelling or varicosities noted  Endometrial biopsy recommended.  Discussed with patient.  Verbal and written consent obtained.   Procedure:  Speculum placed.  Cervix visualized and cleansed with betadine prep.  A single toothed tenaculum was applied to the anterior lip of the cervix.  Endometrial pipelle was advanced through  the cervix into the endometrial cavity without difficulty.  Pipelle passed to7cm.  Suction applied and pipelle removed with good tissue sample obtained.  Tenculum removed.  No bleeding noted.  Patient tolerated procedure well.   Chaperone present for exam, Raechel Ache, RN.  Assessment & Plan:  1. Well woman exam with routine gynecological exam (Primary) - Pap smear neg with neg HR HPV - Mammogram 05/2023.  Will plan MRI in April. - Colonoscopy 6/202r4 - lab work:  none ordered - vaccines reviewed/updated  2. Breast density  3. Menorrhagia with irregular cycle - tranexamic acid (LYSTEDA) 650 MG TABS tablet; Take 2 tablets (1,300 mg total) by mouth 3 (three) times daily. Stop taking once cycle improves.  Do not take for more than 5 days each month.  Dispense: 30 tablet; Refill: 3  4. Irregular bleeding - endometrial biopsy obtained today  5. Increased risk of breast cancer - will plan MRI around April   No orders of the defined types were placed in this encounter.   Meds:  Meds ordered this encounter  Medications   tranexamic acid (LYSTEDA) 650 MG TABS tablet    Sig: Take 2 tablets (1,300 mg total) by mouth 3 (three) times daily. Stop taking once cycle improves.  Do not take for more than 5 days each month.    Dispense:  30 tablet    Refill:  3    Follow-up: Return for post op appts which are already scheduled.  Valentina Shaggy, MD 10/01/2023 10:06 AM

## 2023-10-02 LAB — SURGICAL PATHOLOGY

## 2023-10-20 ENCOUNTER — Encounter (HOSPITAL_BASED_OUTPATIENT_CLINIC_OR_DEPARTMENT_OTHER): Payer: Self-pay | Admitting: Obstetrics & Gynecology

## 2023-10-28 ENCOUNTER — Encounter (HOSPITAL_BASED_OUTPATIENT_CLINIC_OR_DEPARTMENT_OTHER): Payer: Self-pay | Admitting: Obstetrics & Gynecology

## 2023-10-28 ENCOUNTER — Other Ambulatory Visit (HOSPITAL_BASED_OUTPATIENT_CLINIC_OR_DEPARTMENT_OTHER): Payer: Self-pay | Admitting: Obstetrics & Gynecology

## 2023-10-28 DIAGNOSIS — N920 Excessive and frequent menstruation with regular cycle: Secondary | ICD-10-CM

## 2023-10-28 DIAGNOSIS — Z01818 Encounter for other preprocedural examination: Secondary | ICD-10-CM

## 2023-11-07 ENCOUNTER — Other Ambulatory Visit (HOSPITAL_BASED_OUTPATIENT_CLINIC_OR_DEPARTMENT_OTHER): Payer: Self-pay | Admitting: Obstetrics & Gynecology

## 2023-11-07 ENCOUNTER — Encounter (HOSPITAL_BASED_OUTPATIENT_CLINIC_OR_DEPARTMENT_OTHER): Payer: Self-pay | Admitting: Obstetrics & Gynecology

## 2023-11-07 DIAGNOSIS — Z9189 Other specified personal risk factors, not elsewhere classified: Secondary | ICD-10-CM

## 2023-11-10 IMAGING — MR MR BREAST WO/W CM  BILAT
5 series · 30 of 48 positions shown · IV contrast (gadavist)
Comparison: Previous exam(s).

CLINICAL DATA: Abbreviated Breast MRI for breast cancer screening.
Intermediate risk for breast carcinoma. Mammographically dense
breasts.

EXAM:
BILATERAL ABBREVIATED BREAST MRI WITH AND WITHOUT CONTRAST
TECHNIQUE: Multiplanar, multisequence MR images of both breasts were obtained
prior to and following the intravenous administration of 5 ml of
Gadavist

[Series 2: t2_tirm_tra ipat (a-p) · axial · 3.0mm · 0.66mm/px · z∈[-78,+84]mm · 5 of 55 slices shown]
[im 1/55]
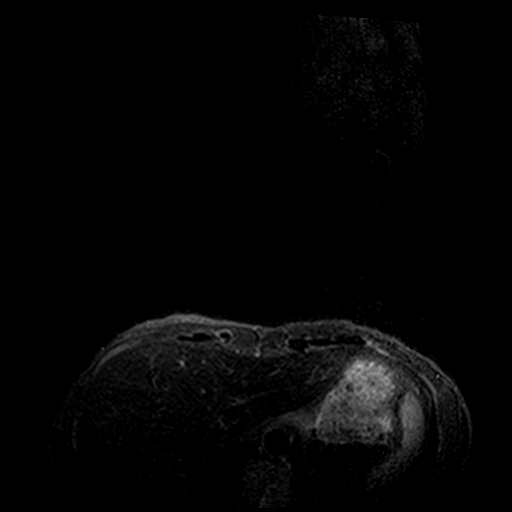
[im 14/55]
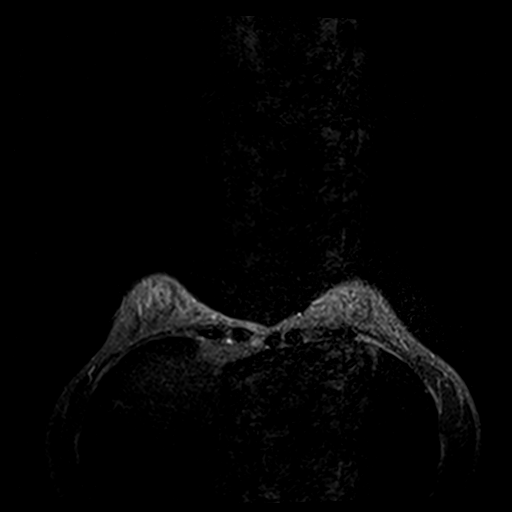
[im 28/55]
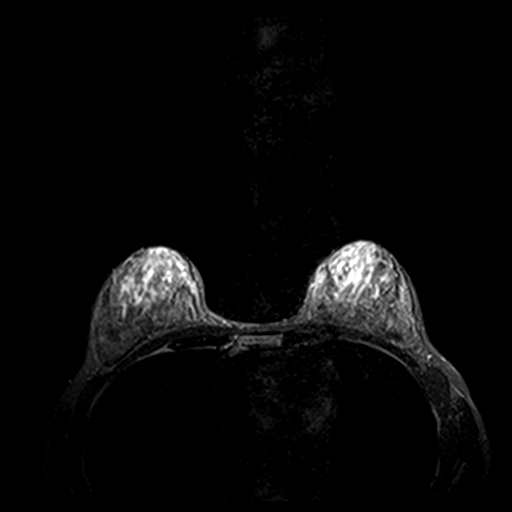
[im 41/55]
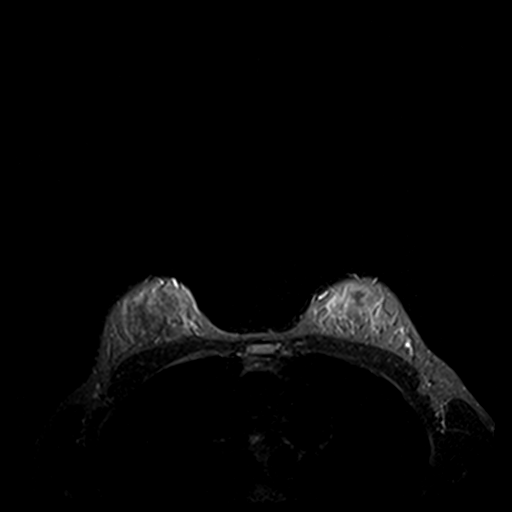
[im 55/55]
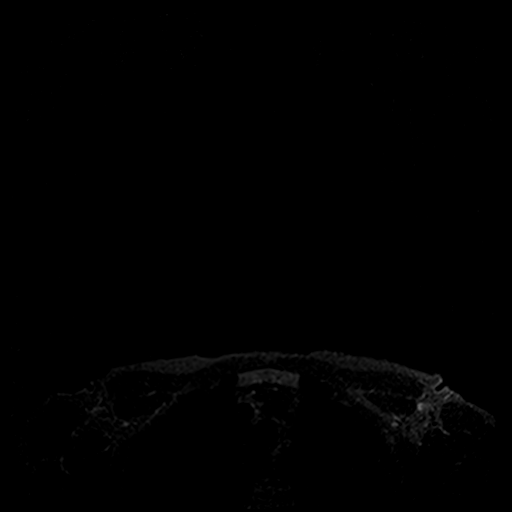

[Series 3: fl3d pre-cm · axial · non-contrast · 1.2mm · 0.89mm/px · z∈[-83,+88]mm · 8 of 144 slices shown]
[im 1/144]
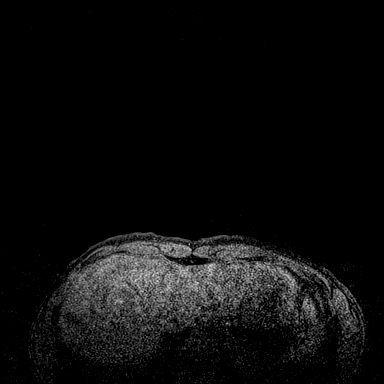
[im 23/144]
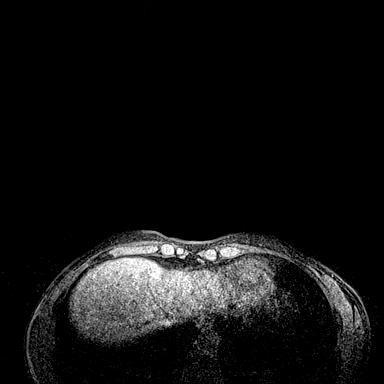
[im 45/144]
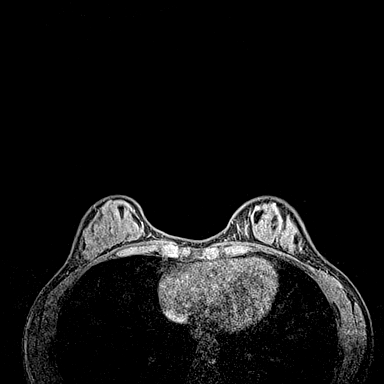
[im 67/144]
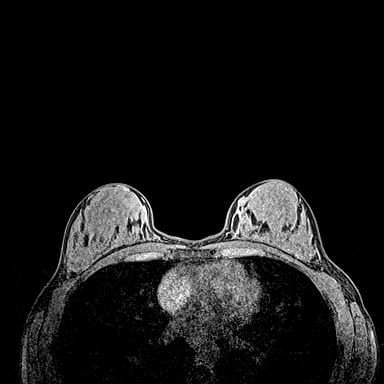
[im 78/144]
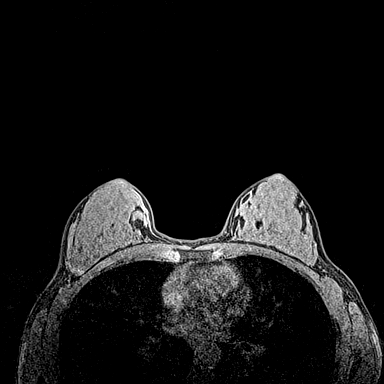
[im 100/144]
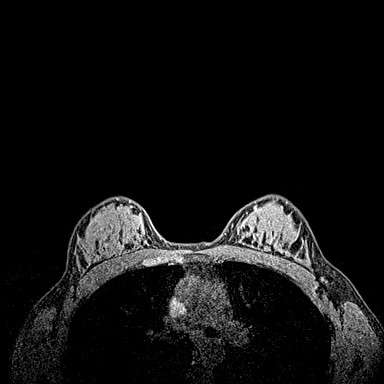
[im 122/144]
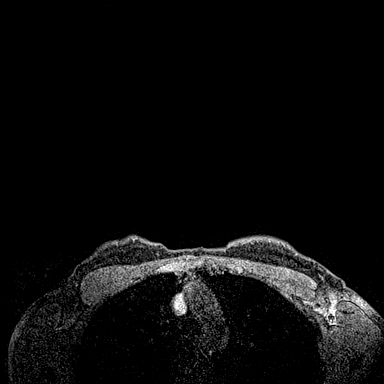
[im 144/144]
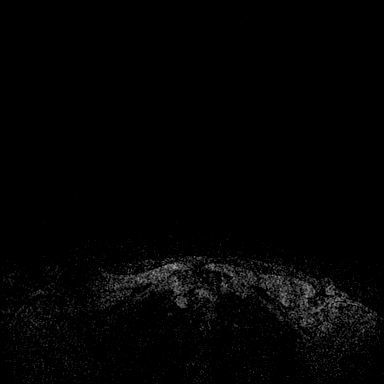

[Series 4: fl3d post-cm 20 · axial · 1.2mm · 0.89mm/px · z∈[-83,+88]mm · 8 of 144 slices shown (1 of 3)]
[im 1/144]
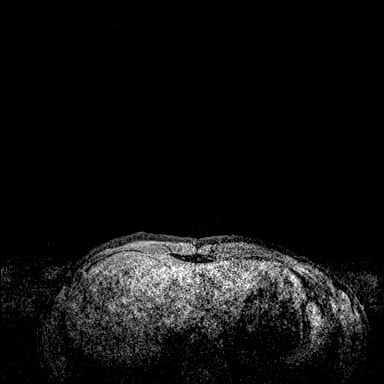
[im 23/144]
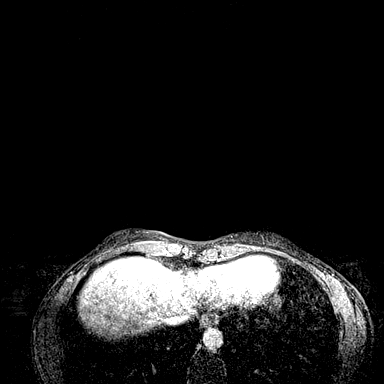
[im 45/144]
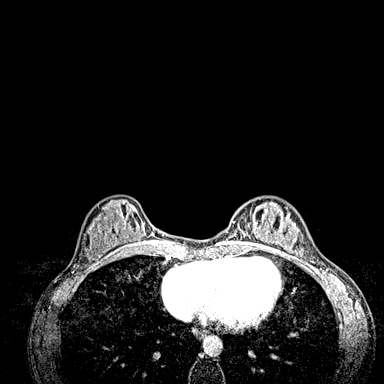
[im 67/144]
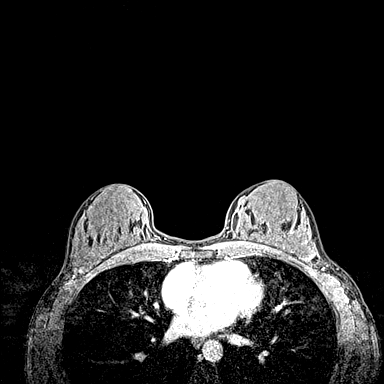
[im 78/144]
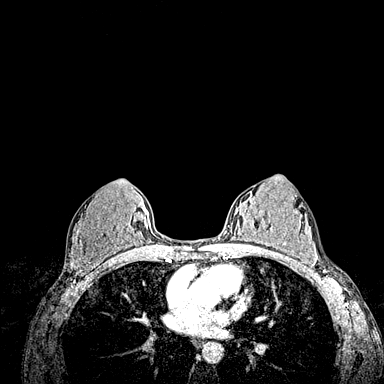
[im 100/144]
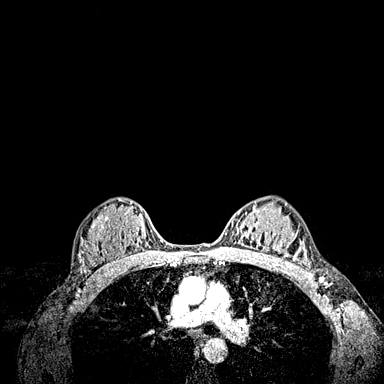
[im 122/144]
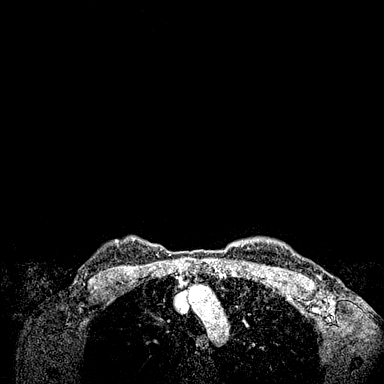
[im 144/144]
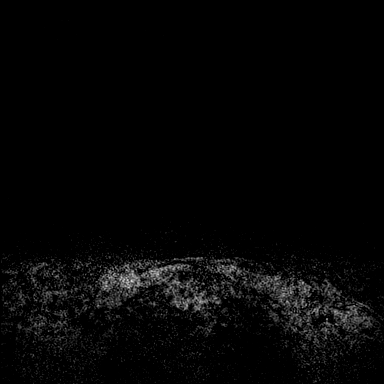

[Series 5: fl3d post-cm 20 · axial · 1.2mm · 0.89mm/px · z∈[-83,+88]mm · 8 of 144 slices shown (2 of 3)]
[im 1/144]
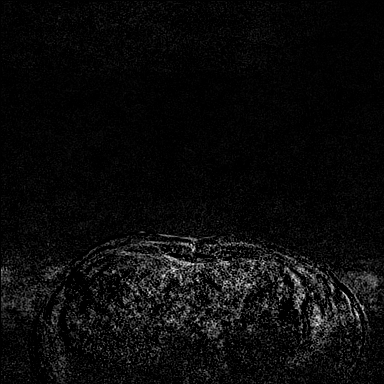
[im 23/144]
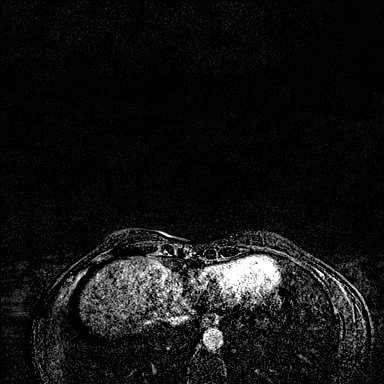
[im 45/144]
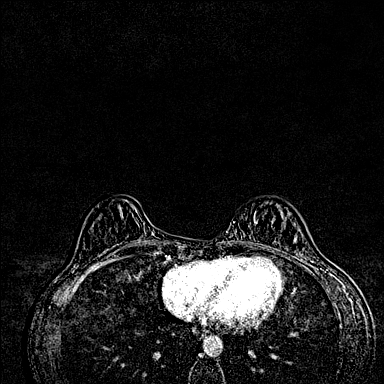
[im 67/144]
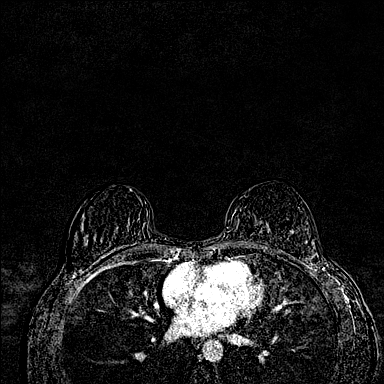
[im 78/144]
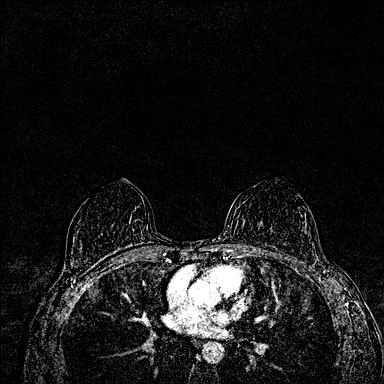
[im 100/144]
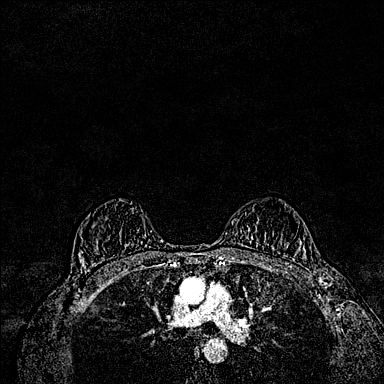
[im 122/144]
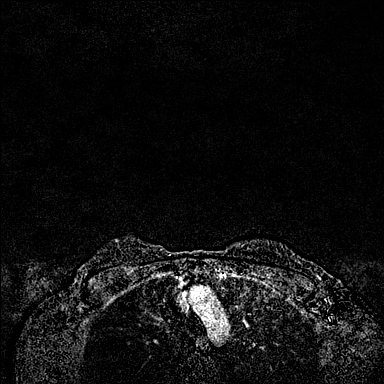
[im 144/144]
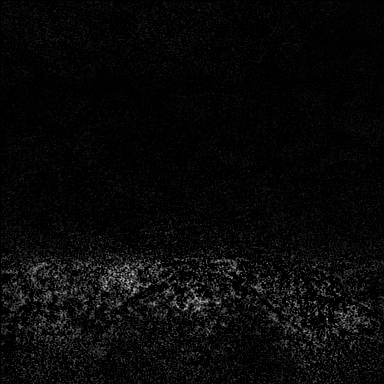

[Series 6: fl3d post-cm 20 · axial · 172.8mm · 0.89mm/px · 1 of 1 slices shown (3 of 3)]
[im 1/1]
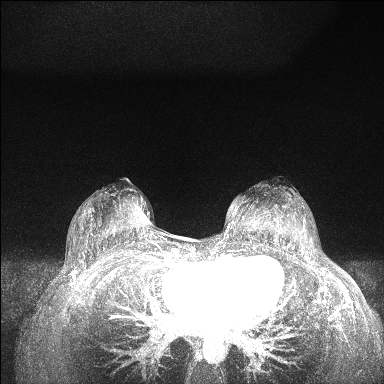

[30 of 48 positions shown; findings below may reference images not displayed]

Three-dimensional MR images were rendered by post-processing of the
original MR data on an independent workstation. The
three-dimensional MR images were interpreted, and findings are
reported in the following complete MRI report for this study. Three
dimensional images were evaluated at the independent [employer]
workstation
FINDINGS: Breast composition: d. Extreme fibroglandular tissue.

Background parenchymal enhancement: Moderate.

Right breast: No mass or abnormal enhancement.

Left breast: No mass or abnormal enhancement.

Lymph nodes: No abnormal appearing lymph nodes.

Ancillary findings:  None.
IMPRESSION: No evidence of breast malignancy.

RECOMMENDATION:
Recommend annual screening mammography. The patient is also eligible
for annual Abbreviated Breast MRI if she wishes.

BI-RADS CATEGORY  1: Negative.

## 2023-11-23 ENCOUNTER — Ambulatory Visit
Admission: RE | Admit: 2023-11-23 | Discharge: 2023-11-23 | Disposition: A | Discharge status: Home / Self Care | Source: Ambulatory Visit | Attending: Obstetrics & Gynecology | Admitting: Obstetrics & Gynecology

## 2023-11-23 DIAGNOSIS — Z9189 Other specified personal risk factors, not elsewhere classified: Secondary | ICD-10-CM

## 2023-11-23 MED ORDER — GADOPICLENOL 0.5 MMOL/ML IV SOLN
5.0000 mL | Freq: Once | INTRAVENOUS | Status: AC | PRN
  Administered 2023-11-23: 5 mL via INTRAVENOUS

## 2023-11-28 ENCOUNTER — Encounter (HOSPITAL_BASED_OUTPATIENT_CLINIC_OR_DEPARTMENT_OTHER): Payer: Self-pay | Admitting: Obstetrics & Gynecology

## 2023-11-28 ENCOUNTER — Encounter (HOSPITAL_COMMUNITY): Payer: Self-pay | Admitting: Obstetrics & Gynecology

## 2023-11-28 NOTE — Pre-Procedure Instructions (Signed)
 Surgical Instructions   Your procedure is scheduled on :  Tuesday,  12-03-2023. Report to [facility] Main Entrance "A" at 5:30  A.M., then check in with the Admitting office. Any questions or running late day of surgery: call [phone]  Questions prior to your surgery date: call [phone], Monday-Friday, 8am-4pm. If you experience any cold or flu symptoms such as cough, fever, chills, shortness of breath, etc. between now and your scheduled surgery, please notify your surgeon office.    Remember:  Do not eat any food after midnight the night before your surgery.  You may have clear liquids from midnight night before surgery until 4:30 AM.   You may drink clear liquids until 4:30 AM the morning of your surgery.   Clear liquids allowed are:  Water Carbonated Beverages Clear Tea (may sweeten, no milk, honey, etc.) Black Coffee Only (may sweeten, NO MILK, CREAM OR POWDERED CREAMER of any kind) Sport drinks, like Gatorade NO clear liquids after 4:30 AM day of surgery.  This includes no water,  candy,  gum,  and  mints.    Take these medicines the morning of surgery with A SIPS OF WATER : Bupropion  (wellbutrin )   May take these medicines IF NEEDED:  none    One week prior to surgery, STOP taking any Aspirin (unless otherwise instructed by your surgeon) Aleve, Naproxen, Ibuprofen , Motrin , Advil , Goody's, BC's, all herbal medications, fish oil, and non-prescription vitamins.                     Do NOT Smoke (Tobacco/Vaping) and Do Not drink alcohol for 24 hours prior to your procedure.  If you use a CPAP at night, you may bring your mask/headgear for your overnight stay.   You will be asked to remove any contacts, glasses, piercing's, hearing aid's, dentures/partials prior to surgery. Please bring cases for these items if needed.    Patients discharged the day of surgery will not be allowed to drive home, and someone needs to stay with them for 24 hours.  SURGICAL WAITING  ROOM VISITATION Patients may have no more than 2 support people in the waiting area - these visitors may rotate.   Pre-op nurse will coordinate an appropriate time for 1 ADULT support person, who may not rotate, to accompany patient in pre-op.  Children under the age of 19 must have an adult with them who is not the patient and must remain in the main waiting area with an adult.  If the patient needs to stay at the hospital during part of their recovery, the visitor guidelines for inpatient rooms apply.  Please refer to the [facility] website for the visitor guidelines for any additional information.   If you received a COVID test during your pre-op visit  it is requested that you wear a mask when out in public, stay away from anyone that may not be feeling well and notify your surgeon if you develop symptoms. If you have been in contact with anyone that has tested positive in the last 10 days please notify you surgeon.      Pre-operative CHG Bathing Instructions   You can play a key role in reducing the risk of infection after surgery. Your skin needs to be as free of germs as possible. You can reduce the number of germs on your skin by washing with CHG (chlorhexidine gluconate) soap before surgery. CHG is an antiseptic soap that kills germs and continues to kill germs even after washing.  DO NOT use if you have an allergy to chlorhexidine/CHG or antibacterial soaps. If your skin becomes reddened or irritated, stop using the CHG and notify Pre-Op nurse day of surgery.  Please get dial soap or other antibacterial soap and shower following the instructions below.             TAKE A SHOWER THE NIGHT BEFORE SURGERY AND THE DAY OF SURGERY    Please keep in mind the following:  DO NOT shave, including legs and underarms, 48 hours prior to surgery.   You may shave your face before/day of surgery.  Place clean sheets on your bed the night before surgery Use a clean washcloth (not used since  being washed) for each shower. DO NOT sleep with pet's night before surgery.  CHG Shower Instructions:  Wash your face and private area with normal soap. If you choose to wash your hair, wash first with your normal shampoo.  After you use shampoo/soap, rinse your hair and body thoroughly to remove shampoo/soap residue.  Turn the water OFF and apply half the bottle of CHG soap to a CLEAN washcloth.  Apply CHG soap ONLY FROM YOUR NECK DOWN TO YOUR TOES (washing for 3-5 minutes)  DO NOT use CHG soap on face, private areas, open wounds, or sores.  Pay special attention to the area where your surgery is being performed.  If you are having back surgery, having someone wash your back for you may be helpful. Wait 2 minutes after CHG soap is applied, then you may rinse off the CHG soap.  Pat dry with a clean towel  Put on clean pajamas    Additional instructions for the day of surgery: DO NOT APPLY any lotions, powders, oils, deodorants (may use underarm deodorant), cologne/ perfumes  or makeup Do not wear jewelry /  piercing's / metal/  permanent jewelry must be removed prior to arrival day of surgery ( no plastic piercing) Do not wear nail polish, gel polish, artificial nails, or any other type of covering on natural finger nails (toe nails are okay) Do not bring valuables to the hospital. [facility] is not responsible for valuables/personal belongings. Put on clean/comfortable clothes.  Please brush your teeth.  Ask your nurse before applying any prescription medications to the skin.

## 2023-11-28 NOTE — Progress Notes (Signed)
 Spoke w/ via phone for pre-op interview--- pt Lab needs dos---- urine preg         Lab results------ lab appt 12-02-2023 @ 1300 getting CBC/ T&S COVID test -----patient states asymptomatic no test needed Arrive at -------  0530 on 12-03-2023 NPO after MN NO Solid Food.  Clear liquids from MN until--- 0430 Pre-Surgery Ensure or G2: n/a ERAS protocol:  yes  Med rec completed Medications to take morning of surgery ----- wellbutrin  Diabetic medication ----- n/a  GLP1 agonist last dose: n/a GLP1 instructions:  Patient instructed no nail polish to be worn day of surgery Patient instructed to bring photo id and insurance card day of surgery Patient aware to have Driver (ride ) / caregiver    for 24 hours after surgery -  husband, Ellen Stewart Patient Special Instructions -----  will pick up bag w/ soap and written instructions at lab appt Pre-Op special Instructions ----- n/a  Patient verbalized understanding of instructions that were given at this phone interview. Patient denies chest pain, sob, fever, cough at the interview.

## 2023-12-02 ENCOUNTER — Encounter (HOSPITAL_COMMUNITY)
Admission: RE | Admit: 2023-12-02 | Discharge: 2023-12-02 | Disposition: A | Discharge status: Home / Self Care | Source: Ambulatory Visit | Attending: Physical Medicine and Rehabilitation | Admitting: Physical Medicine and Rehabilitation

## 2023-12-02 DIAGNOSIS — N920 Excessive and frequent menstruation with regular cycle: Secondary | ICD-10-CM | POA: Insufficient documentation

## 2023-12-02 DIAGNOSIS — Z01812 Encounter for preprocedural laboratory examination: Secondary | ICD-10-CM | POA: Insufficient documentation

## 2023-12-02 DIAGNOSIS — Z01818 Encounter for other preprocedural examination: Secondary | ICD-10-CM

## 2023-12-02 LAB — TYPE AND SCREEN
ABO/RH(D): O POS
Antibody Screen: NEGATIVE

## 2023-12-02 LAB — CBC
HCT: 43.6 % (ref 36.0–46.0)
Hemoglobin: 14.6 g/dL (ref 12.0–15.0)
MCH: 31.8 pg (ref 26.0–34.0)
MCHC: 33.5 g/dL (ref 30.0–36.0)
MCV: 95 fL (ref 80.0–100.0)
Platelets: 317 10*3/uL (ref 150–400)
RBC: 4.59 MIL/uL (ref 3.87–5.11)
RDW: 12.2 % (ref 11.5–15.5)
WBC: 6.7 10*3/uL (ref 4.0–10.5)
nRBC: 0 % (ref 0.0–0.2)

## 2023-12-03 ENCOUNTER — Encounter (HOSPITAL_COMMUNITY)
Admission: RE | Disposition: A | Discharge status: Home / Self Care | Payer: Self-pay | Source: Home / Self Care | Attending: Obstetrics & Gynecology

## 2023-12-03 ENCOUNTER — Other Ambulatory Visit: Payer: Self-pay

## 2023-12-03 ENCOUNTER — Ambulatory Visit (HOSPITAL_BASED_OUTPATIENT_CLINIC_OR_DEPARTMENT_OTHER): Admitting: Anesthesiology

## 2023-12-03 ENCOUNTER — Ambulatory Visit (HOSPITAL_COMMUNITY): Admitting: Anesthesiology

## 2023-12-03 ENCOUNTER — Ambulatory Visit (HOSPITAL_COMMUNITY)
Admission: RE | Admit: 2023-12-03 | Discharge: 2023-12-03 | Disposition: A | Discharge status: Home / Self Care | Payer: Managed Care, Other (non HMO) | Attending: Obstetrics & Gynecology | Admitting: Obstetrics & Gynecology

## 2023-12-03 ENCOUNTER — Encounter (HOSPITAL_COMMUNITY): Payer: Self-pay | Admitting: Obstetrics & Gynecology

## 2023-12-03 ENCOUNTER — Other Ambulatory Visit (HOSPITAL_COMMUNITY): Payer: Self-pay

## 2023-12-03 ENCOUNTER — Other Ambulatory Visit (HOSPITAL_BASED_OUTPATIENT_CLINIC_OR_DEPARTMENT_OTHER): Payer: Self-pay | Admitting: Obstetrics & Gynecology

## 2023-12-03 DIAGNOSIS — Z01818 Encounter for other preprocedural examination: Secondary | ICD-10-CM

## 2023-12-03 DIAGNOSIS — N8000 Endometriosis of the uterus, unspecified: Secondary | ICD-10-CM | POA: Diagnosis not present

## 2023-12-03 DIAGNOSIS — D252 Subserosal leiomyoma of uterus: Secondary | ICD-10-CM | POA: Diagnosis not present

## 2023-12-03 DIAGNOSIS — N920 Excessive and frequent menstruation with regular cycle: Secondary | ICD-10-CM | POA: Diagnosis not present

## 2023-12-03 DIAGNOSIS — D259 Leiomyoma of uterus, unspecified: Secondary | ICD-10-CM

## 2023-12-03 DIAGNOSIS — N809 Endometriosis, unspecified: Secondary | ICD-10-CM

## 2023-12-03 HISTORY — PX: TOTAL LAPAROSCOPIC HYSTERECTOMY WITH SALPINGECTOMY: SHX6742

## 2023-12-03 HISTORY — DX: Excessive and frequent menstruation with irregular cycle: N92.1

## 2023-12-03 HISTORY — PX: CYSTOSCOPY: SHX5120

## 2023-12-03 HISTORY — DX: Personal history of other diseases of the female genital tract: Z87.42

## 2023-12-03 HISTORY — DX: Personal history of other diseases of the digestive system: Z87.19

## 2023-12-03 HISTORY — PX: LAPAROSCOPIC APPENDECTOMY: SHX408

## 2023-12-03 HISTORY — DX: Personal history of urinary calculi: Z87.442

## 2023-12-03 LAB — POCT PREGNANCY, URINE: Preg Test, Ur: NEGATIVE

## 2023-12-03 LAB — HEMOGLOBIN: Hemoglobin: 13 g/dL (ref 12.0–15.0)

## 2023-12-03 SURGERY — HYSTERECTOMY, TOTAL, LAPAROSCOPIC, WITH SALPINGECTOMY
Anesthesia: General

## 2023-12-03 MED ORDER — EPHEDRINE 5 MG/ML INJ
INTRAVENOUS | Status: AC
  Filled 2023-12-03: qty 5

## 2023-12-03 MED ORDER — FENTANYL CITRATE (PF) 250 MCG/5ML IJ SOLN
INTRAMUSCULAR | Status: AC
Start: 2023-12-03 — End: ?
  Filled 2023-12-03: qty 5

## 2023-12-03 MED ORDER — SODIUM CHLORIDE (PF) 0.9 % IJ SOLN
INTRAMUSCULAR | Status: AC
  Filled 2023-12-03: qty 50

## 2023-12-03 MED ORDER — MIDAZOLAM HCL 5 MG/5ML IJ SOLN
INTRAMUSCULAR | Status: DC | PRN
  Administered 2023-12-03: 2 mg via INTRAVENOUS

## 2023-12-03 MED ORDER — LACTATED RINGERS IV SOLN: INTRAVENOUS | Status: DC

## 2023-12-03 MED ORDER — DROPERIDOL 2.5 MG/ML IJ SOLN: 0.6250 mg | Freq: Once | INTRAMUSCULAR | Status: DC | PRN

## 2023-12-03 MED ORDER — BUPIVACAINE HCL (PF) 0.25 % IJ SOLN
INTRAMUSCULAR | Status: AC
  Filled 2023-12-03: qty 30

## 2023-12-03 MED ORDER — GABAPENTIN 100 MG PO CAPS
ORAL_CAPSULE | ORAL | Status: AC
  Filled 2023-12-03: qty 1

## 2023-12-03 MED ORDER — ROCURONIUM BROMIDE 10 MG/ML (PF) SYRINGE
PREFILLED_SYRINGE | INTRAVENOUS | Status: AC
  Filled 2023-12-03: qty 10

## 2023-12-03 MED ORDER — ONDANSETRON HCL 4 MG/2ML IJ SOLN: 4.0000 mg | Freq: Four times a day (QID) | INTRAMUSCULAR | Status: DC | PRN

## 2023-12-03 MED ORDER — GABAPENTIN 100 MG PO CAPS
100.0000 mg | ORAL_CAPSULE | Freq: Three times a day (TID) | ORAL | 0 refills | Status: DC
  Filled 2023-12-03: qty 60, 20d supply, fill #0

## 2023-12-03 MED ORDER — ROPIVACAINE HCL 5 MG/ML IJ SOLN
INTRAMUSCULAR | Status: AC
  Filled 2023-12-03: qty 30

## 2023-12-03 MED ORDER — ALUM & MAG HYDROXIDE-SIMETH 200-200-20 MG/5ML PO SUSP: 30.0000 mL | ORAL | Status: DC | PRN

## 2023-12-03 MED ORDER — ONDANSETRON HCL 4 MG PO TABS: 4.0000 mg | ORAL_TABLET | Freq: Four times a day (QID) | ORAL | Status: DC | PRN

## 2023-12-03 MED ORDER — PROPOFOL 1000 MG/100ML IV EMUL
INTRAVENOUS | Status: AC
  Filled 2023-12-03: qty 400

## 2023-12-03 MED ORDER — HYDROMORPHONE HCL 1 MG/ML IJ SOLN: 0.2000 mg | INTRAMUSCULAR | Status: DC | PRN

## 2023-12-03 MED ORDER — KETOROLAC TROMETHAMINE 30 MG/ML IJ SOLN
INTRAMUSCULAR | Status: DC | PRN
  Administered 2023-12-03: 25 mg via INTRAVENOUS

## 2023-12-03 MED ORDER — ACETAMINOPHEN 500 MG PO TABS: 1000.0000 mg | ORAL_TABLET | Freq: Once | ORAL | Status: AC

## 2023-12-03 MED ORDER — LIDOCAINE 2% (20 MG/ML) 5 ML SYRINGE
INTRAMUSCULAR | Status: AC
  Filled 2023-12-03: qty 5

## 2023-12-03 MED ORDER — FENTANYL CITRATE (PF) 100 MCG/2ML IJ SOLN
25.0000 ug | INTRAMUSCULAR | Status: DC | PRN
Start: 2023-12-03 — End: 2023-12-03

## 2023-12-03 MED ORDER — CEFOTETAN DISODIUM 2 G IJ SOLR
2.0000 g | INTRAMUSCULAR | Status: AC
  Administered 2023-12-03: 2 g via INTRAVENOUS
  Filled 2023-12-03: qty 2

## 2023-12-03 MED ORDER — LIDOCAINE 2% (20 MG/ML) 5 ML SYRINGE
INTRAMUSCULAR | Status: DC | PRN
  Administered 2023-12-03: 40 mg via INTRAVENOUS

## 2023-12-03 MED ORDER — BUPIVACAINE HCL (PF) 0.25 % IJ SOLN
INTRAMUSCULAR | Status: DC | PRN
  Administered 2023-12-03: 13 mL

## 2023-12-03 MED ORDER — ORAL CARE MOUTH RINSE: 15.0000 mL | Freq: Once | OROMUCOSAL | Status: AC

## 2023-12-03 MED ORDER — CHLORHEXIDINE GLUCONATE 0.12 % MT SOLN
15.0000 mL | Freq: Once | OROMUCOSAL | Status: AC
  Administered 2023-12-03: 15 mL via OROMUCOSAL

## 2023-12-03 MED ORDER — KETOROLAC TROMETHAMINE 30 MG/ML IJ SOLN
INTRAMUSCULAR | Status: AC
  Filled 2023-12-03: qty 1

## 2023-12-03 MED ORDER — ROCURONIUM BROMIDE 10 MG/ML (PF) SYRINGE
PREFILLED_SYRINGE | INTRAVENOUS | Status: DC | PRN
  Administered 2023-12-03: 20 mg via INTRAVENOUS
  Administered 2023-12-03: 30 mg via INTRAVENOUS
  Administered 2023-12-03 (×2): 20 mg via INTRAVENOUS

## 2023-12-03 MED ORDER — PROPOFOL 10 MG/ML IV BOLUS
INTRAVENOUS | Status: AC
  Filled 2023-12-03: qty 20

## 2023-12-03 MED ORDER — SIMETHICONE 80 MG PO CHEW: 80.0000 mg | CHEWABLE_TABLET | Freq: Four times a day (QID) | ORAL | Status: DC | PRN

## 2023-12-03 MED ORDER — PROPOFOL 10 MG/ML IV BOLUS
INTRAVENOUS | Status: DC | PRN
  Administered 2023-12-03: 120 mg via INTRAVENOUS
  Administered 2023-12-03 (×2): 20 mg via INTRAVENOUS

## 2023-12-03 MED ORDER — MIDAZOLAM HCL 2 MG/2ML IJ SOLN
INTRAMUSCULAR | Status: AC
  Filled 2023-12-03: qty 2

## 2023-12-03 MED ORDER — GABAPENTIN 300 MG PO CAPS
300.0000 mg | ORAL_CAPSULE | Freq: Three times a day (TID) | ORAL | Status: DC
  Administered 2023-12-03: 300 mg via ORAL
  Filled 2023-12-03: qty 1

## 2023-12-03 MED ORDER — STERILE WATER FOR IRRIGATION IR SOLN
Status: DC | PRN
  Administered 2023-12-03: 1000 mL

## 2023-12-03 MED ORDER — PROPOFOL 500 MG/50ML IV EMUL
INTRAVENOUS | Status: DC | PRN
  Administered 2023-12-03: 125 ug/kg/min via INTRAVENOUS
  Administered 2023-12-03: 200 ug/kg/min via INTRAVENOUS

## 2023-12-03 MED ORDER — SODIUM CHLORIDE 0.9 % IV SOLN: INTRAVENOUS | Status: DC | PRN

## 2023-12-03 MED ORDER — ONDANSETRON HCL 4 MG/2ML IJ SOLN
INTRAMUSCULAR | Status: DC | PRN
  Administered 2023-12-03: 4 mg via INTRAVENOUS

## 2023-12-03 MED ORDER — ACETAMINOPHEN 500 MG PO TABS
1000.0000 mg | ORAL_TABLET | ORAL | Status: AC
  Administered 2023-12-03: 1000 mg via ORAL

## 2023-12-03 MED ORDER — FENTANYL CITRATE (PF) 100 MCG/2ML IJ SOLN
INTRAMUSCULAR | Status: DC | PRN
  Administered 2023-12-03 (×4): 50 ug via INTRAVENOUS

## 2023-12-03 MED ORDER — GABAPENTIN 100 MG PO CAPS
100.0000 mg | ORAL_CAPSULE | ORAL | Status: AC
  Administered 2023-12-03: 100 mg via ORAL

## 2023-12-03 MED ORDER — DEXMEDETOMIDINE HCL IN NACL 80 MCG/20ML IV SOLN
INTRAVENOUS | Status: DC | PRN
Start: 2023-12-03 — End: 2023-12-03
  Administered 2023-12-03: 4 ug via INTRAVENOUS
  Administered 2023-12-03: 8 ug via INTRAVENOUS

## 2023-12-03 MED ORDER — ONDANSETRON HCL 4 MG/2ML IJ SOLN
INTRAMUSCULAR | Status: AC
  Filled 2023-12-03: qty 2

## 2023-12-03 MED ORDER — PANTOPRAZOLE SODIUM 40 MG IV SOLR: 40.0000 mg | Freq: Every day | INTRAVENOUS | Status: DC

## 2023-12-03 MED ORDER — MENTHOL 3 MG MT LOZG: 1.0000 | LOZENGE | OROMUCOSAL | Status: DC | PRN

## 2023-12-03 MED ORDER — TOBRAMYCIN 0.3 % OP OINT
TOPICAL_OINTMENT | Freq: Four times a day (QID) | OPHTHALMIC | Status: DC
  Filled 2023-12-03: qty 3.5

## 2023-12-03 MED ORDER — HYDROCODONE-ACETAMINOPHEN 5-325 MG PO TABS
1.0000 | ORAL_TABLET | Freq: Four times a day (QID) | ORAL | 0 refills | Status: DC | PRN
  Filled 2023-12-03: qty 30, 4d supply, fill #0

## 2023-12-03 MED ORDER — SUGAMMADEX SODIUM 200 MG/2ML IV SOLN
INTRAVENOUS | Status: DC | PRN
  Administered 2023-12-03: 100 mg via INTRAVENOUS

## 2023-12-03 MED ORDER — DEXAMETHASONE SODIUM PHOSPHATE 10 MG/ML IJ SOLN
INTRAMUSCULAR | Status: AC
  Filled 2023-12-03: qty 1

## 2023-12-03 MED ORDER — FLUORESCEIN SODIUM 10 % IV SOLN
INTRAVENOUS | Status: AC
  Filled 2023-12-03: qty 5

## 2023-12-03 MED ORDER — DEXMEDETOMIDINE HCL IN NACL 80 MCG/20ML IV SOLN
INTRAVENOUS | Status: AC
Start: 2023-12-03 — End: ?
  Filled 2023-12-03: qty 20

## 2023-12-03 MED ORDER — POVIDONE-IODINE 10 % EX SWAB: 2.0000 | Freq: Once | CUTANEOUS | Status: DC

## 2023-12-03 MED ORDER — DEXTROSE-SODIUM CHLORIDE 5-0.45 % IV SOLN: INTRAVENOUS | Status: DC

## 2023-12-03 MED ORDER — PHENYLEPHRINE HCL-NACL 20-0.9 MG/250ML-% IV SOLN
INTRAVENOUS | Status: AC
  Filled 2023-12-03: qty 500

## 2023-12-03 MED ORDER — CHLORHEXIDINE GLUCONATE 0.12 % MT SOLN
OROMUCOSAL | Status: AC
  Filled 2023-12-03: qty 15

## 2023-12-03 MED ORDER — ARTIFICIAL TEARS OPHTHALMIC OINT
TOPICAL_OINTMENT | OPHTHALMIC | Status: AC
  Filled 2023-12-03: qty 3.5

## 2023-12-03 MED ORDER — HEMOSTATIC AGENTS (NO CHARGE) OPTIME
TOPICAL | Status: DC | PRN
  Administered 2023-12-03: 1 via TOPICAL

## 2023-12-03 MED ORDER — ACETAMINOPHEN 500 MG PO TABS
ORAL_TABLET | ORAL | Status: AC
  Filled 2023-12-03: qty 2

## 2023-12-03 MED ORDER — SODIUM CHLORIDE 0.9 % IR SOLN
Status: DC | PRN
  Administered 2023-12-03: 1
  Administered 2023-12-03: 1 via INTRAVESICAL

## 2023-12-03 MED ORDER — DEXAMETHASONE SODIUM PHOSPHATE 10 MG/ML IJ SOLN
INTRAMUSCULAR | Status: DC | PRN
  Administered 2023-12-03: 5 mg via INTRAVENOUS

## 2023-12-03 MED ORDER — HYDROCODONE-ACETAMINOPHEN 5-325 MG PO TABS
1.0000 | ORAL_TABLET | ORAL | Status: DC | PRN
  Administered 2023-12-03 (×2): 2 via ORAL
  Filled 2023-12-03 (×2): qty 2

## 2023-12-03 SURGICAL SUPPLY — 58 items
APPLICATOR ARISTA FLEXITIP XL (MISCELLANEOUS) IMPLANT
APPLICATOR COTTON TIP 6 STRL (MISCELLANEOUS) IMPLANT
APPLICATOR COTTON TIP 6IN STRL (MISCELLANEOUS) ×2 IMPLANT
CABLE HIGH FREQUENCY MONO STRZ (ELECTRODE) ×2 IMPLANT
COVER MAYO STAND STRL (DRAPES) ×2 IMPLANT
COVER SURGICAL LIGHT HANDLE (MISCELLANEOUS) ×2 IMPLANT
CUTTER FLEX LINEAR 45M (STAPLE) IMPLANT
DERMABOND ADVANCED .7 DNX12 (GAUZE/BANDAGES/DRESSINGS) ×2 IMPLANT
DRAPE SURG IRRIG POUCH 19X23 (DRAPES) ×2 IMPLANT
DURAPREP 26ML APPLICATOR (WOUND CARE) ×2 IMPLANT
GLOVE BIO SURGEON STRL SZ 6.5 (GLOVE) ×2 IMPLANT
GLOVE BIOGEL PI IND STRL 6.5 (GLOVE) ×2 IMPLANT
GLOVE ECLIPSE 6.5 STRL STRAW (GLOVE) ×4 IMPLANT
GLOVE SURG UNDER POLY LF SZ7 (GLOVE) ×8 IMPLANT
GOWN STRL REUS W/ TWL LRG LVL3 (GOWN DISPOSABLE) ×8 IMPLANT
GOWN STRL REUS W/ TWL XL LVL3 (GOWN DISPOSABLE) ×2 IMPLANT
HEMOSTAT ARISTA ABSORB 3G PWDR (HEMOSTASIS) IMPLANT
HIBICLENS CHG 4% 4OZ BTL (MISCELLANEOUS) ×2 IMPLANT
IRRIGATION SUCT STRKRFLW 2 WTP (MISCELLANEOUS) ×2 IMPLANT
KIT PINK PAD W/HEAD ARE REST (MISCELLANEOUS) ×2 IMPLANT
KIT PINK PAD W/HEAD ARM REST (MISCELLANEOUS) ×2 IMPLANT
KIT TURNOVER KIT B (KITS) ×2 IMPLANT
LIGASURE VESSEL 5MM BLUNT TIP (ELECTROSURGICAL) ×2 IMPLANT
NDL INSUFFLATION 14GA 120MM (NEEDLE) ×2 IMPLANT
NEEDLE INSUFFLATION 14GA 120MM (NEEDLE) ×2 IMPLANT
NS IRRIG 1000ML POUR BTL (IV SOLUTION) ×2 IMPLANT
OCCLUDER COLPOPNEUMO (BALLOONS) ×2 IMPLANT
PACK LAPAROSCOPY BASIN (CUSTOM PROCEDURE TRAY) ×2 IMPLANT
POUCH LAPAROSCOPIC INSTRUMENT (MISCELLANEOUS) ×2 IMPLANT
RELOAD 45 VASCULAR/THIN (ENDOMECHANICALS) ×2 IMPLANT
RELOAD STAPLE 45 2.5 WHT GRN (ENDOMECHANICALS) IMPLANT
SCALPEL HRMNC RUM II 2.5 SILVR (DISPOSABLE) IMPLANT
SCISSORS LAP 5X35 DISP (ENDOMECHANICALS) ×2 IMPLANT
SET CYSTO W/LG BORE CLAMP LF (SET/KITS/TRAYS/PACK) ×2 IMPLANT
SET TRI-LUMEN FLTR TB AIRSEAL (TUBING) ×2 IMPLANT
SET TUBE SMOKE EVAC HIGH FLOW (TUBING) ×2 IMPLANT
SHEARS HARMONIC ACE PLUS 36CM (ENDOMECHANICALS) ×2 IMPLANT
SLEEVE SCD COMPRESS KNEE MED (STOCKING) ×2 IMPLANT
SUT VIC AB 0 CT1 27XBRD ANBCTR (SUTURE) ×4 IMPLANT
SUT VIC AB 4-0 PS2 18 (SUTURE) ×4 IMPLANT
SUT VICRYL 0 UR6 27IN ABS (SUTURE) IMPLANT
SUT VLOC 180 0 9IN GS21 (SUTURE) ×2 IMPLANT
SYR 10ML LL (SYRINGE) ×2 IMPLANT
SYR 50ML LL SCALE MARK (SYRINGE) ×4 IMPLANT
SYSTEM CARTER THOMASON II (TROCAR) IMPLANT
TIP UTERINE 5.1X6CM LAV DISP (MISCELLANEOUS) IMPLANT
TIP UTERINE 6.7X10CM GRN DISP (MISCELLANEOUS) IMPLANT
TIP UTERINE 6.7X6CM WHT DISP (MISCELLANEOUS) IMPLANT
TIP UTERINE 6.7X8CM BLUE DISP (MISCELLANEOUS) IMPLANT
TOWEL GREEN STERILE FF (TOWEL DISPOSABLE) ×4 IMPLANT
TRAY FOLEY W/BAG SLVR 14FR (SET/KITS/TRAYS/PACK) ×2 IMPLANT
TROCAR ADV FIXATION 5X100MM (TROCAR) ×2 IMPLANT
TROCAR PORT AIRSEAL 5X120 (TROCAR) ×2 IMPLANT
TROCAR XCEL NON BLADE 8MM B8LT (ENDOMECHANICALS) ×2 IMPLANT
TROCAR XCEL NON-BLD 5MMX100MML (ENDOMECHANICALS) ×2 IMPLANT
TROCAR Z THREAD OPTICAL 12X100 (TROCAR) IMPLANT
UNDERPAD 30X36 HEAVY ABSORB (UNDERPADS AND DIAPERS) ×2 IMPLANT
WARMER LAPAROSCOPE (MISCELLANEOUS) ×2 IMPLANT

## 2023-12-03 NOTE — Discharge Instructions (Signed)
 Post Op Hysterectomy Instructions Please read the instructions below. Refer to these instructions for the next few weeks. These instructions provide you with general information on caring for yourself after surgery. Your caregiver may also give you specific instructions. While your treatment has been planned according to the most current medical practices available, unavoidable problems sometimes happen. If you have any problems or questions after you leave, please call your caregiver.  HOME CARE INSTRUCTIONS Healing will take time. You will have discomfort, tenderness, swelling and bruising at the operative site for a couple of weeks. This is normal and will get better as time goes on.  Only take over-the-counter or prescription medicines for pain, discomfort or fever as directed by your caregiver.  Do not take aspirin. It can cause bleeding.  Do not drive when taking pain medication.  Resume your usual diet as directed and allowed.  Get plenty of rest and sleep.  Do not douche, use tampons, or have sexual intercourse until your caregiver gives you permission. .  Take your temperature if you feel hot or flushed.  You may shower today when you get home.  No tub bath for two weeks. Do not drink alcohol until you are not taking any narcotic pain medications.  Try to have someone home with you for a week or two to help with the household activities.   Be careful over the next two to three weeks with any activities at home that involve lifting, pushing, or pulling.  Listen to your body--if something feels uncomfortable to do, then don't do it. Make sure you and your family understands everything about your operation and recovery.  Walking up stairs is fine. Do not sign any legal documents until you feel normal again.  Keep all your follow-up appointments as recommended by your caregiver.   PLEASE CALL THE OFFICE IF: There is swelling, redness or increasing pain in the wound area.  Pus is coming  from the wound.  You notice a bad smell from the wound or surgical dressing.  You have pain, redness and swelling from the intravenous site.  The wound is breaking open (the edges are not staying together).   You develop pain or bleeding when you urinate.  You develop abnormal vaginal discharge.  You have any type of abnormal reaction or develop an allergy to your medication.  You need stronger pain medication for your pain   SEEK IMMEDIATE MEDICAL CARE: You develop a temperature of 100.5 or higher.  You develop abdominal pain.  You develop chest pain.  You develop shortness of breath.  You pass out.  You develop pain, swelling or redness of your leg.  You develop heavy vaginal bleeding with or without blood clots.   MEDICATIONS: Restart your regular medications BUT wait one week before restarting all vitamins and mineral supplements You may use an over the counter stool softener like Colace or Dulcolax to help with starting a bowel movement.  Start the day after you go home.  Warm liquids, fluids, and ambulation help too.  If you have not had a bowel movement in four days, you need to call the office. You can take two vicodin every 4-6 hours as needed.  If you only need one, you can take one additional 650mg  tylenol tablet.  If you only feel you need tylenol, you can take two 650mg  tablets 3 times daily.  Do not take more than 4 grams of tylenol in a day.   You can also take the gabapentin as  needed for pain up to three times daily.  You can take 1-3 capsules.

## 2023-12-03 NOTE — Progress Notes (Signed)
*   Day of Surgery * Procedure(s) (LRB): HYSTERECTOMY, TOTAL, LAPAROSCOPIC, WITH SALPINGECTOMY (Bilateral) CYSTOSCOPY (N/A) APPENDECTOMY, LAPAROSCOPIC (N/A)  Subjective: Patient reports itching/painful left eye.  Was able to put some antibiotic ointment on it and this has helped.  No nausea.  Good pain control.  Voiding.  Has eaten.  Walking the halls.    Objective: I have reviewed patient's vital signs, intake and output, medications, and labs. Vitals:   12/03/23 1200 12/03/23 1300  BP: 126/79 119/80  Pulse: 67 73  Resp: 14 15  Temp:  98 F (36.7 C)  SpO2: 100% 100%    General: alert and no distress Resp: normal breathing  Cardio: regular rate and rhythm GI: incision: c/d/I and small hematoma around suprapubic incision and abdomen soft, non distended Extremities: extremities normal, atraumatic, no cyanosis or edema Vaginal Bleeding: minimal  Assessment: s/p Procedure(s): HYSTERECTOMY, TOTAL, LAPAROSCOPIC, WITH SALPINGECTOMY (Bilateral) CYSTOSCOPY (N/A) APPENDECTOMY, LAPAROSCOPIC (N/A): stable and progressing well  Plan: Discharge home  LOS: 0 days    Lillian Rein, MD 12/03/2023, 4:36 PM

## 2023-12-03 NOTE — Anesthesia Preprocedure Evaluation (Signed)
 Anesthesia Evaluation  Patient identified by MRN, date of birth, ID band Patient awake    Reviewed: Allergy & Precautions, NPO status , Patient's Chart, lab work & pertinent test results  History of Anesthesia Complications (+) PROLONGED EMERGENCE and history of anesthetic complications  Airway Mallampati: III  TM Distance: >3 FB Neck ROM: Full    Dental  (+) Dental Advisory Given   Pulmonary neg pulmonary ROS   breath sounds clear to auscultation       Cardiovascular negative cardio ROS  Rhythm:Regular Rate:Normal     Neuro/Psych negative neurological ROS     GI/Hepatic Neg liver ROS, PUD,,,  Endo/Other  negative endocrine ROS    Renal/GU negative Renal ROS     Musculoskeletal   Abdominal   Peds  Hematology negative hematology ROS (+)   Anesthesia Other Findings   Reproductive/Obstetrics                             Anesthesia Physical Anesthesia Plan  ASA: 1  Anesthesia Plan: General   Post-op Pain Management: Tylenol PO (pre-op)*, Gabapentin PO (pre-op)* and Toradol IV (intra-op)*   Induction: Intravenous  PONV Risk Score and Plan: 4 or greater and Midazolam, TIVA, Propofol  infusion, Dexamethasone, Ondansetron and Treatment may vary due to age or medical condition  Airway Management Planned: Oral ETT  Additional Equipment:   Intra-op Plan:   Post-operative Plan: Extubation in OR  Informed Consent: I have reviewed the patients History and Physical, chart, labs and discussed the procedure including the risks, benefits and alternatives for the proposed anesthesia with the patient or authorized representative who has indicated his/her understanding and acceptance.     Dental advisory given  Plan Discussed with: CRNA  Anesthesia Plan Comments:        Anesthesia Quick Evaluation

## 2023-12-03 NOTE — H&P (Signed)
 Ellen Stewart is an 43 y.o. female G2P2 MWF with h/o menorrhagia who has failed hormonal therapy and is currently using TXA with some improvement who is ready for definitive management.  She has undergone ultrasound and endometrial biopsy with no significant abnormalities.  She does not want to be pregnant in the future and is here for definitive treatment with hysterectomy.  Risks, benefits and alternatives have been discussed.  She is here with spouse, questions answered and ready to proceed.    Pertinent Gynecological History: Menses:  menorrhagia Contraception: condoms DES exposure: denies Blood transfusions: none Sexually transmitted diseases: no past history Previous GYN Procedures:  h/o hysteroscopy   Last mammogram: normal Date: 05/2023 Last pap: normal Date: 09/2022 OB History: G0, P0   Menstrual History Patient's last menstrual period was 11/18/2023 (exact date).    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 sig.   History of colitis    History of kidney stones    Menorrhagia with irregular cycle     Past Surgical History:  Procedure Laterality Date   COLONOSCOPY WITH PROPOFOL   01/23/2023   @[facility] -- OP ENDO  by dr r. bloomfield   CYSTOSCOPY, WITH RETROGRADE PYELOGRAM, URETEROSCOPY, URINARY CALCULUS LASER LITHOTRIPSY, AND STENT INSERT     2000  and 2003   HIP ARTHROSCOPY Right 08/13/2013   @[facility] --WS by dr a. Darene Economy;  Synnovectomy of central & peripheral compartments/  Labral repair/ Chondroplasty of acetabulum/ Femoroplasty/ Debridement of ligamentum teres/ Greater trochanteric buresectomy/ Ischifemoral space decompression   HYSTEROSCOPY WITH D & C  06/12/2019   @[facility]  by dr r. white;   polypectomy   TONSILLECTOMY AND ADENOIDECTOMY  1986    Family History  Problem  Relation Age of Onset   Obesity Mother    Hypertension Mother    Cervical cancer Maternal Grandmother        pt pretty sure   Cancer - Colon Maternal Uncle 56    Social History:  reports that she has never smoked. She has never used smokeless tobacco. She reports that she does not drink alcohol and does not use drugs.  Allergies:  Allergies  Allergen Reactions   Oxycodone Nausea And Vomiting    Uncontrollable Vomiting     Ibuprofen  Rash    Medications Prior to Admission  Medication Sig Dispense Refill Last Dose/Taking   acetaminophen (TYLENOL 8 HOUR ARTHRITIS PAIN) 650 MG CR tablet Take 650 mg by mouth every 8 (eight) hours as needed for pain.   Past Week   acetaminophen (TYLENOL) 500 MG tablet Take 500 mg by mouth every 6 (six) hours as needed.   Past Week   buPROPion  (WELLBUTRIN  XL) 150 MG 24 hr tablet TAKE THREE TABLETS BY MOUTH DAILY (Patient taking differently: Take 300 mg by mouth daily.) 270 tablet 2 12/03/2023 at  4:05 AM   FIBER PO Take 3 capsules by mouth daily.   Past Week   MAGNESIUM GLYCINATE PO Take 3 capsules by mouth at bedtime.   12/01/2023   Misc Natural Products (PROGESTERONE EX) Apply topically at bedtime.   12/01/2023   Omega-3 Fatty Acids (FISH OIL PO) Take 3 capsules by mouth daily.   Past Week   tranexamic acid  (LYSTEDA ) 650 MG TABS tablet Take 2 tablets (1,300 mg total) by mouth 3 (  three) times daily. Stop taking once cycle improves.  Do not take for more than 5 days each month. (Patient taking differently: Take 1,300 mg by mouth as directed. Stop taking once cycle improves.  Do not take for more than 5 days each month,  takes 2 tabs TID) 30 tablet 3 Past Month   TURMERIC PO Take 1 capsule by mouth daily.   Past Week   valACYclovir (VALTREX) 500 MG tablet Take 1 tablet by mouth daily as needed (cold sore).   Past Week    Review of Systems  Constitutional: Negative.   Gastrointestinal: Negative.   Genitourinary: Negative.     Blood pressure (!) 134/92,  pulse 73, temperature 97.7 F (36.5 C), resp. rate 17, height 5\' 1"  (1.549 m), weight 49.9 kg, last menstrual period 11/18/2023, SpO2 95%. Physical Exam Constitutional:      Appearance: Normal appearance.  Cardiovascular:     Rate and Rhythm: Normal rate and regular rhythm.  Pulmonary:     Effort: Pulmonary effort is normal.     Breath sounds: Normal breath sounds.  Neurological:     General: No focal deficit present.     Mental Status: She is alert.  Psychiatric:        Behavior: Behavior normal.     Results for orders placed or performed during the hospital encounter of 12/03/23 (from the past 24 hours)  Pregnancy, urine POC     Status: None   Collection Time: 12/03/23  6:15 AM  Result Value Ref Range   Preg Test, Ur NEGATIVE NEGATIVE  ABO/Rh     Status: None (Preliminary result)   Collection Time: 12/03/23  6:45 AM  Result Value Ref Range   ABO/RH(D) PENDING     No results found.  Assessment/Plan: 43 yo G0 with h/o menorrhagia who has failed conservative management, does not want IUD or other hormonal therapy and is here for definitive treatment with TLH/bilateral salpingectomy, possible oophorectomy, cystoscopy.  Pt ready to proceed.  Lillian Rein 12/03/2023, 7:09 AM

## 2023-12-03 NOTE — Anesthesia Procedure Notes (Signed)
 Procedure Name: Intubation Date/Time: 12/03/2023 7:36 AM  Performed by: Ezzie Holstein, CRNAPre-anesthesia Checklist: Patient identified, Emergency Drugs available, Suction available and Patient being monitored Patient Re-evaluated:Patient Re-evaluated prior to induction Oxygen Delivery Method: Circle System Utilized Preoxygenation: Pre-oxygenation with 100% oxygen Induction Type: IV induction Ventilation: Mask ventilation without difficulty Laryngoscope Size: Mac and 3 Grade View: Grade I Tube type: Oral Tube size: 7.0 mm Number of attempts: 1 Airway Equipment and Method: Stylet Placement Confirmation: ETT inserted through vocal cords under direct vision, positive ETCO2 and breath sounds checked- equal and bilateral Secured at: 22 cm Tube secured with: Tape Dental Injury: Teeth and Oropharynx as per pre-operative assessment

## 2023-12-03 NOTE — Op Note (Signed)
 12/03/2023  10:36 AM  PATIENT:  Ellen Stewart  43 y.o. female  PRE-OPERATIVE DIAGNOSIS:  Menorrhagia, fibroid uterus  POST-OPERATIVE DIAGNOSIS:  Menorrhagia, fibroid uterus, endometriosis, abnormal appearing appendix  PROCEDURE:  Procedure(s): HYSTERECTOMY, TOTAL, LAPAROSCOPIC, WITH SALPINGECTOMY CYSTOSCOPY APPENDECTOMY, LAPAROSCOPIC  SURGEON:  Lillian Rein  ASSISTANTS: Dr. Merideth Stands.  An experienced assistant was required given the standard of surgical care given the complexity of the case.  This assistant was needed for exposure, dissection, suctioning, retraction, instrument exchange and for overall help during the procedure.  RNFA help was also unavailable.  ANESTHESIA:   general  ESTIMATED BLOOD LOSS: 25 mL  BLOOD ADMINISTERED:none   FLUIDS: 1200cc LR  UOP: 50cc concentrated urine  SPECIMEN:  uterus, cervix, bilateral fallopian tubes, appendix  DISPOSITION OF SPECIMEN:  PATHOLOGY  FINDINGS: posterior fibroid on uterus, normal ovaries and fallopian tubes, thickened and tortuous appendix with purplish lesion concerning for endometriosis, endometriosis in the deep cul de sac new colon  DESCRIPTION OF OPERATION: Patient is taken to the operating room. She is placed in the supine position. She is a running IV in place. Informed consent was present on the chart. SCDs on her lower extremities and functioning properly. Patient was positioned while she was awake.  Her legs were placed in the low lithotomy position in [place] stirrups.   General endotracheal anesthesia was administered by the anesthesia staff without difficulty.  Her arms were then tucked by the side. Dr. Harwood Lingo, anesthesia, oversaw case.  Time out performed.    Clora prep was then used to prep the abdomen and Hibiclens was used to prep the inner thighs, perineum and vagina. Once 3 minutes had past the patient was draped in a normal standard fashion. The legs were lifted to the high lithotomy position. The  cervix was visualized by placing a heavy weighted speculum in the posterior aspect of the vagina and using a curved Deaver retractor to the retract anteriorly. The anterior lip of the cervix was grasped with single-tooth tenaculum.  The cervix sounded to 7 cm. Pratt dilators were used to dilate the cervix up to a #21. A RUMI uterine manipulator was obtained. A #6 disposable tip was placed on the RUMI manipulator as well as a 2.5, silver KOH ring. This was passed through the cervix and the bulb of the disposable tip was inflated with 10 cc of normal saline. There was a good fit of the KOH ring around the cervix. The tenaculum was removed. There is also good manipulation of the uterus. The speculum and retractor were removed as well. A Foley catheter was placed to straight drain.  Clear urine was noted. Legs were lowered to the low lithotomy position and attention was turned the abdomen.  The umbilicus was everted.  Marcaine 0.25% used to anesthetize the skin.  Using #11 blade, 5mm skin incision was made.  A Veress needle was obtained. Syringe of sterile saline was placed on a open Veress needle.  With the abdomen elevated, the Veress needle was passed into the umbilicus until the pop was heard and then fluid started to drip.  Then low flow CO2 gas was attached the needle and the pneumoperitoneum was achieved without difficulty. Once four liters of gas was in the abdomen the Veress needle was removed and a 5 millimeter non-bladed Optiview trocar and port were passed directly to the abdomen. The laparoscope was then used to confirm intraperitoneal placement. Findings included fibroid uterus, endometriosis in the posterior cul de sac and abnormal appearing appendix  concerning for endometriosis.  General surgery consult was called at this point to look at the appendix for possible removal.  Locations for RLQ, LLQ, and suprapubic ports were noted by transillumination of the abdominal wall.  0.25% marcaine was used to  anesthetize the skin.  8mm skin incision was made in the RLQ and an AirSeal port was placed underdirect visualization of the laparoscope.  Then a 5mm skin incision was made and a 5mm nonbladed trochar and port was placed in the LLQ.  Finally, and 8mm skin incision was made about 4cm above the pubic symphasis and an 8mm non-bladed port was placed with direct visualization of the laparoscope.  All trochars were removed.    Ureters were identifies.  Attention was turned to the left side. With uterus on stretch the left tube was excised off the ovary and mesosalpinx was dissected to free the tube. Then the left utero-ovarian pedicle was serially clamped cauterized and incised using the ligasure device. Left round ligament was serially clamped cauterized and incised. The anterior and posterior peritoneum of the inferior leaf of the broad ligament were opened. The beginning of the bladder flap was created.  The bladder was taken down below the level of the KOH ring. The left uterine artery skeletonized and then just superior to the KOH ring this vessel was serially clamped, cauterized, and incised.  Attention was turned the right side.  The uterus was placed on stretch to the opposite side.  The tube was excised off the ovary using sharp dissection a bipolar cautery.  The mesosalpinx was incised freeing the tube. Then the right uterine ovarian pedicle was serially clamped cauterized and incised. Next the right round ligament was serially clamped cauterized and incised. The anterior posterior peritoneum of the inferiorly for the broad ligament were opened. The anterior peritoneum was carried across to the dissection on the left side. The remainder of the bladder flap was created using sharp dissection. The bladder was well below the level of the KOH ring. The right uterine artery skeletonized. Then the right uterine artery, above the level of the KOH ring, was serially clamped cauterized and incised. The uterus was  devascularized at this point.  At this point, Dr. Lanell Pinta was present in the OR.  She also felt the appendix was abnormal appearing and should be removed.  Verbal consent was obtained from her husband and personally by me.  Dr. Lanell Pinta scrubbed and gowned and proceeding with appendectomy.  This will be dictated separately.  The LLQ incision was enlarged to a 12 port.    Once she was done and there was no bleeding from the appendix confirmed, I resumed the planned procedure.  At this point, it was time to complete the colpotomy which was performed by starting in the midline and using a harmonic scalpel with the inferior edge of the open blade  This was carried around a circumferential fashion until the vaginal mucosa was completely incised in the specimen was freed.  The specimen was then delivered to the vagina.  A vaginal occlusive device was used to maintain the pneumoperitoneum  Instruments were changed with a needle driver and Kobra graspers.  Using a 9 inch V. lock suture, the cuff was closed by incorporating the anterior and posterior vaginal mucosa in each stitch. This was carried across all the way to the left corner and a running fashion. Two stitches were brought back towards the midline and the suture was cut flush with the vagina. The needle was brought  out the pelvis. The pelvis was irrigated. All pedicles were inspected. No bleeding was noted.   Co2 pressures were lowered to 8mm Hg.  Again, no bleeding was noted.  Ureters were noted deep in the pelvis to be peristalsing.  Arista was placed along the pedicles.  At this point the procedure was completed.  The remaining instruments were removed.  The ports (except the suprapubic port) were removed under direct visualization of the laparoscope and the pneumoperitoneum was relieved.  The patient was taken out of Trendelenburg positioning.  Several deep breaths were given to the patient's trying to any gas the abdomen and finally the suprapubic port was  removed.  The skin was then closed with subcuticular stitches of 3-0 Vicryl. The skin was cleansed Dermabond was applied. Attention was then turned the vagina and the cuff was inspected. No bleeding was noted. The anterior posterior vaginal mucosa was incorporated in each stitch. The Foley catheter was removed.  Cystoscopy was performed.  No sutures or bladder injuries were noted.  Ureters were noted with normal urine jets from each one was seen.  Foley was left out after the cystoscopic fluid was drained and cystoscope removed.  Sponge, lap, needle, instrument counts were correct x2. Patient tolerated the procedure very well. She was awakened from anesthesia, extubated and taken to recovery in stable condition.    COUNTS:  YES  PLAN OF CARE: Transfer to PACU

## 2023-12-03 NOTE — Op Note (Signed)
 Operative Report  Ellen Stewart 43 y.o. female  [id number]  [id number]  12/03/2023  Surgeon: Aldon Hung MD FACS   Procedure performed: Laparoscopic Appendectomy   Preop diagnosis: endometriosis   Post-op diagnosis/intraop findings: same   Specimens: appendix   EBL: minimal   Complications: none   Description of procedure: Intraoperative consult requested by Dr. Annabell Key who was performing hysterectomy and identified changes consistent with endometriosis along the appendix.  Dr. Annabell Key discussed with the patient's husband proceeding with appendectomy which I agree is reasonable.  The patient is already under general anesthesia and trocars have been placed and the operation is underway.  The appendix is mildly dilated and folded on itself with scar tissue with a small blue nodule on the posterior aspect along the mesenteric border concerning for endometriosis implant.  The appendix was grasped and retracted anteriorly.  A window was created at the base of the mesoappendix using blunt dissection.  A white load 45 mm Endo GIA stapler was then used to transect the appendix along with a small cuff of healthy appearing cecum, ensuring no impingement on the ileocecal valve.  The mesoappendix was then divided with the harmonic scalpel.  Hemostasis was excellent.  The staple line was closely inspected and confirmed to be healthy, intact, and hemostatic.  At this point case was turned back over to Dr. Annabell Key; the appendix will be removed along with the uterus through the vaginal cuff at the end of the case.

## 2023-12-03 NOTE — Transfer of Care (Signed)
 Immediate Anesthesia Transfer of Care Note  Patient: Ellen Stewart  Procedure(s) Performed: HYSTERECTOMY, TOTAL, LAPAROSCOPIC, WITH SALPINGECTOMY (Bilateral) CYSTOSCOPY APPENDECTOMY, LAPAROSCOPIC  Patient Location: PACU  Anesthesia Type:General  Level of Consciousness: awake, alert , oriented, and patient cooperative  Airway & Oxygen Therapy: Patient Spontanous Breathing and Patient connected to face mask oxygen  Post-op Assessment: Report given to RN and Post -op Vital signs reviewed and stable  Post vital signs: Reviewed and stable  Last Vitals:  Vitals Value Taken Time  BP 111/78 12/03/23 1002  Temp    Pulse 74 12/03/23 1006  Resp 13 12/03/23 1006  SpO2 100 % 12/03/23 1006  Vitals shown include unfiled device data.  Last Pain:  Vitals:   12/03/23 0634  PainSc: 0-No pain      Patients Stated Pain Goal: 5 (12/03/23 8413)  Complications: No notable events documented.

## 2023-12-03 NOTE — Anesthesia Postprocedure Evaluation (Signed)
 Anesthesia Post Note  Patient: Serena A Alarie  Procedure(s) Performed: HYSTERECTOMY, TOTAL, LAPAROSCOPIC, WITH SALPINGECTOMY (Bilateral) CYSTOSCOPY APPENDECTOMY, LAPAROSCOPIC     Patient location during evaluation: PACU Anesthesia Type: General Level of consciousness: awake and alert and oriented Pain management: pain level controlled Vital Signs Assessment: post-procedure vital signs reviewed and stable Respiratory status: spontaneous breathing, nonlabored ventilation and respiratory function stable Cardiovascular status: blood pressure returned to baseline and stable Postop Assessment: no apparent nausea or vomiting Anesthetic complications: no   No notable events documented.  Last Vitals:  Vitals:   12/03/23 1045 12/03/23 1100  BP: 124/69   Pulse: 66 64  Resp: 11 10  Temp:    SpO2: 98% 100%    Last Pain:  Vitals:   12/03/23 1030  PainSc: 3    Pain Goal: Patients Stated Pain Goal: 5 (12/03/23 1030)                 Hamlet Lasecki A.

## 2023-12-04 ENCOUNTER — Encounter (HOSPITAL_COMMUNITY): Payer: Self-pay | Admitting: Surgery

## 2023-12-04 ENCOUNTER — Encounter (HOSPITAL_BASED_OUTPATIENT_CLINIC_OR_DEPARTMENT_OTHER): Payer: Self-pay | Admitting: Obstetrics & Gynecology

## 2023-12-04 ENCOUNTER — Other Ambulatory Visit (HOSPITAL_BASED_OUTPATIENT_CLINIC_OR_DEPARTMENT_OTHER): Payer: Self-pay | Admitting: Obstetrics & Gynecology

## 2023-12-04 DIAGNOSIS — Z9889 Other specified postprocedural states: Secondary | ICD-10-CM

## 2023-12-04 LAB — ABO/RH: ABO/RH(D): O POS

## 2023-12-04 MED ORDER — PROMETHAZINE HCL 12.5 MG PO TABS: ORAL_TABLET | ORAL | 0 refills | Status: AC

## 2023-12-04 NOTE — Telephone Encounter (Signed)
 Called pt in response to myChart message. Pt states that has taken zofran in the past and one other medication for nausea but she doesn't remember the name. Advised that Dr. Annabell Key could send a prescription for either but the zofran can cause constipation and the phenergan can cause drowsiness. Pt prefers to have phenergan.  She reports that the hematoma is somewhat smaller.

## 2023-12-05 LAB — SURGICAL PATHOLOGY

## 2023-12-09 ENCOUNTER — Encounter (HOSPITAL_BASED_OUTPATIENT_CLINIC_OR_DEPARTMENT_OTHER): Payer: Self-pay | Admitting: Obstetrics & Gynecology

## 2023-12-12 ENCOUNTER — Encounter (HOSPITAL_BASED_OUTPATIENT_CLINIC_OR_DEPARTMENT_OTHER): Payer: Self-pay | Admitting: Obstetrics & Gynecology

## 2023-12-12 ENCOUNTER — Ambulatory Visit (HOSPITAL_BASED_OUTPATIENT_CLINIC_OR_DEPARTMENT_OTHER): Payer: Self-pay | Admitting: Obstetrics & Gynecology

## 2023-12-12 VITALS — BP 120/82 | HR 71 | Ht 61.0 in | Wt 110.6 lb

## 2023-12-12 DIAGNOSIS — Z9889 Other specified postprocedural states: Secondary | ICD-10-CM

## 2023-12-12 NOTE — Progress Notes (Signed)
 GYNECOLOGY  VISIT/  CC:   post op recheck  HPI: 43 y.o. G0P0000 Married White or Caucasian female here for recheck after undergoing TLH/bilateral salpingectomy/appendectomy on 12/03/2023.  She reports bleeding is minimal.  She has soreness with some pain on the LLQ side of abdomen.  This was the larger incision for the appendectomy so she was advised this would be more tender.  Bowel function is Normal.  Bladder function is normal.    Pathology reviewed:  Yes .  Questions answered.    MEDS:   Current Outpatient Medications on File Prior to Visit  Medication Sig Dispense Refill   acetaminophen  (TYLENOL  8 HOUR ARTHRITIS PAIN) 650 MG CR tablet Take 650 mg by mouth every 8 (eight) hours as needed for pain.     buPROPion  (WELLBUTRIN  XL) 150 MG 24 hr tablet TAKE THREE TABLETS BY MOUTH DAILY (Patient taking differently: Take 300 mg by mouth daily.) 270 tablet 2   FIBER PO Take 3 capsules by mouth daily.     gabapentin  (NEURONTIN ) 100 MG capsule Take 1 capsule (100 mg total) by mouth 3 (three) times daily. Take between 100 - 300mg  every 8 hours as needed for pain 60 capsule 0   HYDROcodone -acetaminophen  (NORCO/VICODIN) 5-325 MG tablet Take 1-2 tablets by mouth every 6 (six) hours as needed for moderate pain (pain score 4-6). 30 tablet 0   MAGNESIUM GLYCINATE PO Take 3 capsules by mouth at bedtime.     Misc Natural Products (PROGESTERONE EX) Apply topically at bedtime.     Omega-3 Fatty Acids (FISH OIL PO) Take 3 capsules by mouth daily.     promethazine  (PHENERGAN ) 12.5 MG tablet Take 1 -2 tablets every 6 hours as needed for nausea 30 tablet 0   TURMERIC PO Take 1 capsule by mouth daily.     valACYclovir (VALTREX) 500 MG tablet Take 1 tablet by mouth daily as needed (cold sore).     No current facility-administered medications on file prior to visit.    SH:  Smoking: No    PHYSICAL EXAMINATION:    BP 120/82   Pulse 71   Ht 5\' 1"  (1.549 m)   Wt 110 lb 9.6 oz (50.2 kg)   LMP 11/18/2023  (Exact Date)   BMI 20.90 kg/m     General appearance: alert, cooperative and appears stated age CV:  Regular rate and rhythm Lungs:  clear to auscultation, no wheezes, rales or rhonchi, symmetric air entry Abdomen: soft, non-tender; bowel sounds normal; no masses,  no organomegaly Incisions:  C/D/I  Pelvic: deferred  Assessment/Plan: 1. Post-operative state (Primary) - post op instructions reviewed.  She has driven her car.  Exercise discussed.  Pelvic rest continued.  Recheck 4 weeks.

## 2024-01-06 ENCOUNTER — Encounter (HOSPITAL_BASED_OUTPATIENT_CLINIC_OR_DEPARTMENT_OTHER): Payer: Self-pay | Admitting: Obstetrics & Gynecology

## 2024-01-06 ENCOUNTER — Ambulatory Visit (HOSPITAL_BASED_OUTPATIENT_CLINIC_OR_DEPARTMENT_OTHER): Payer: Self-pay | Admitting: Obstetrics & Gynecology

## 2024-01-06 VITALS — BP 145/87 | HR 70 | Ht 61.0 in | Wt 110.6 lb

## 2024-01-06 DIAGNOSIS — R300 Dysuria: Secondary | ICD-10-CM

## 2024-01-06 DIAGNOSIS — N809 Endometriosis, unspecified: Secondary | ICD-10-CM

## 2024-01-06 DIAGNOSIS — Z9889 Other specified postprocedural states: Secondary | ICD-10-CM

## 2024-01-06 LAB — POCT URINALYSIS DIPSTICK
Bilirubin, UA: NEGATIVE
Blood, UA: NEGATIVE
Glucose, UA: NEGATIVE
Ketones, UA: NEGATIVE
Nitrite, UA: NEGATIVE
Protein, UA: NEGATIVE
Spec Grav, UA: 1.025 (ref 1.010–1.025)
Urobilinogen, UA: 0.2 U/dL
pH, UA: 5.5 (ref 5.0–8.0)

## 2024-01-06 NOTE — Progress Notes (Signed)
 GYNECOLOGY  VISIT  CC:   post op recheck  HPI: 43 y.o. G0P0000 Married White or Caucasian female here for recheck after undergoing TLH/bilateral salpingectomy/cystoscopy on 12/03/2023.  She reports no bleeding.  She has had some urinary dysuria that has come and gone.  She took some probiotics.   Bowel function is normal.  Still having soreness.    She is walking for exercise.  MEDS:   Current Outpatient Medications on File Prior to Visit  Medication Sig Dispense Refill   acetaminophen  (TYLENOL  8 HOUR ARTHRITIS PAIN) 650 MG CR tablet Take 650 mg by mouth every 8 (eight) hours as needed for pain.     buPROPion  (WELLBUTRIN  XL) 150 MG 24 hr tablet TAKE THREE TABLETS BY MOUTH DAILY (Patient taking differently: Take 300 mg by mouth daily.) 270 tablet 2   FIBER PO Take 3 capsules by mouth daily.     MAGNESIUM GLYCINATE PO Take 3 capsules by mouth at bedtime.     Misc Natural Products (PROGESTERONE EX) Apply topically at bedtime.     Omega-3 Fatty Acids (FISH OIL PO) Take 3 capsules by mouth daily.     promethazine  (PHENERGAN ) 12.5 MG tablet Take 1 -2 tablets every 6 hours as needed for nausea 30 tablet 0   TURMERIC PO Take 1 capsule by mouth daily.     valACYclovir (VALTREX) 500 MG tablet Take 1 tablet by mouth daily as needed (cold sore).     gabapentin  (NEURONTIN ) 100 MG capsule Take 1 capsule (100 mg total) by mouth 3 (three) times daily. Take between 100 - 300mg  every 8 hours as needed for pain (Patient not taking: Reported on 01/06/2024) 60 capsule 0   HYDROcodone -acetaminophen  (NORCO/VICODIN) 5-325 MG tablet Take 1-2 tablets by mouth every 6 (six) hours as needed for moderate pain (pain score 4-6). (Patient not taking: Reported on 01/06/2024) 30 tablet 0   No current facility-administered medications on file prior to visit.    SH:  Smoking No    PHYSICAL EXAMINATION:    BP (!) 145/87 (BP Location: Left Arm, Patient Position: Sitting, Cuff Size: Normal)   Pulse 70   Ht 5\' 1"  (1.549 m)    Wt 110 lb 9.6 oz (50.2 kg)   LMP 11/18/2023 (Exact Date)   BMI 20.90 kg/m     General appearance: alert, cooperative and appears stated age CV:  Regular rate and rhythm Lungs:  clear to auscultation, no wheezes, rales or rhonchi, symmetric air entry Abdomen: soft, non-tender; bowel sounds normal; no masses,  no organomegaly Incisions:  C/D/I  Pelvic: External genitalia:  no lesions              Urethra:  normal appearing urethra with no masses, tenderness or lesions              Bartholins and Skenes: normal                 Vagina: normal appearing vagina with normal color and discharge, no lesions              Cervix: absent              Bimanual Exam:  Uterus:  uterus absent              Adnexa: no mass, fullness, tenderness  Chaperone, Myrtie Atkinson, was present for exam.  Assessment/Plan: 1. Post-operative state (Primary) - recheck 1 month - instructions for post op activity discussed.  Pelvic rest for 12 full weeks post op discussed  again with pt.    2. Dysuria - POCT Urinalysis Dipstick - Urine Culture  3. Endometriosis

## 2024-01-07 LAB — URINE CULTURE: Organism ID, Bacteria: NO GROWTH

## 2024-01-08 ENCOUNTER — Ambulatory Visit (HOSPITAL_BASED_OUTPATIENT_CLINIC_OR_DEPARTMENT_OTHER): Payer: Self-pay | Admitting: Obstetrics & Gynecology

## 2024-01-08 ENCOUNTER — Encounter (HOSPITAL_BASED_OUTPATIENT_CLINIC_OR_DEPARTMENT_OTHER): Payer: Self-pay | Admitting: Obstetrics & Gynecology

## 2024-01-08 DIAGNOSIS — R928 Other abnormal and inconclusive findings on diagnostic imaging of breast: Secondary | ICD-10-CM

## 2024-01-22 ENCOUNTER — Encounter (HOSPITAL_BASED_OUTPATIENT_CLINIC_OR_DEPARTMENT_OTHER): Payer: Self-pay | Admitting: Obstetrics & Gynecology

## 2024-01-22 DIAGNOSIS — F3289 Other specified depressive episodes: Secondary | ICD-10-CM

## 2024-01-22 MED ORDER — BUPROPION HCL ER (XL) 150 MG PO TB24: 450.0000 mg | ORAL_TABLET | Freq: Every day | ORAL | 6 refills | Status: AC

## 2024-02-05 ENCOUNTER — Encounter (HOSPITAL_BASED_OUTPATIENT_CLINIC_OR_DEPARTMENT_OTHER): Admitting: Obstetrics & Gynecology

## 2024-02-08 ENCOUNTER — Ambulatory Visit
Admission: RE | Admit: 2024-02-08 | Discharge: 2024-02-08 | Disposition: A | Discharge status: Home / Self Care | Source: Ambulatory Visit | Attending: Obstetrics & Gynecology | Admitting: Obstetrics & Gynecology

## 2024-02-08 DIAGNOSIS — R928 Other abnormal and inconclusive findings on diagnostic imaging of breast: Secondary | ICD-10-CM

## 2024-02-08 MED ORDER — GADOPICLENOL 0.5 MMOL/ML IV SOLN
5.0000 mL | Freq: Once | INTRAVENOUS | Status: AC | PRN
  Administered 2024-02-08: 5 mL via INTRAVENOUS

## 2024-02-10 ENCOUNTER — Ambulatory Visit (HOSPITAL_BASED_OUTPATIENT_CLINIC_OR_DEPARTMENT_OTHER): Payer: Self-pay | Admitting: Obstetrics & Gynecology

## 2024-02-10 DIAGNOSIS — Z1231 Encounter for screening mammogram for malignant neoplasm of breast: Secondary | ICD-10-CM

## 2024-06-02 ENCOUNTER — Ambulatory Visit
Admission: RE | Admit: 2024-06-02 | Discharge: 2024-06-02 | Disposition: A | Discharge status: Home / Self Care | Source: Ambulatory Visit | Attending: Obstetrics & Gynecology | Admitting: Obstetrics & Gynecology

## 2024-06-02 DIAGNOSIS — Z1231 Encounter for screening mammogram for malignant neoplasm of breast: Secondary | ICD-10-CM
